# Patient Record
Sex: Female | Born: 2000 | Race: Black or African American | Hispanic: No | Marital: Single | Smoking: Never smoker
Health system: Southern US, Community
[De-identification: ages and names within clinical notes are randomized; demographics above are authoritative.]

## PROBLEM LIST (undated history)

## (undated) DIAGNOSIS — J45909 Unspecified asthma, uncomplicated: Secondary | ICD-10-CM

## (undated) DIAGNOSIS — I10 Essential (primary) hypertension: Secondary | ICD-10-CM

## (undated) HISTORY — PX: ABDOMINAL SURGERY: SHX537

---
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
## 2018-01-11 ENCOUNTER — Telehealth: Payer: Self-pay

## 2018-01-11 NOTE — Telephone Encounter (Signed)
Pt called to confirm upcoming appt.

## 2018-01-13 ENCOUNTER — Ambulatory Visit (INDEPENDENT_AMBULATORY_CARE_PROVIDER_SITE_OTHER): Payer: Medicaid Other | Admitting: Family Medicine

## 2018-01-13 ENCOUNTER — Other Ambulatory Visit (HOSPITAL_COMMUNITY)
Admission: RE | Admit: 2018-01-13 | Discharge: 2018-01-13 | Disposition: A | Discharge status: Home / Self Care | Payer: Medicaid Other | Source: Ambulatory Visit | Attending: Family Medicine | Admitting: Family Medicine

## 2018-01-13 ENCOUNTER — Encounter: Payer: Self-pay | Admitting: Family Medicine

## 2018-01-13 VITALS — BP 111/69 | HR 84 | Ht 64.5 in | Wt 285.6 lb

## 2018-01-13 DIAGNOSIS — Z87440 Personal history of urinary (tract) infections: Secondary | ICD-10-CM

## 2018-01-13 DIAGNOSIS — Z3046 Encounter for surveillance of implantable subdermal contraceptive: Secondary | ICD-10-CM | POA: Diagnosis not present

## 2018-01-13 DIAGNOSIS — Z01419 Encounter for gynecological examination (general) (routine) without abnormal findings: Secondary | ICD-10-CM

## 2018-01-13 DIAGNOSIS — Z0000 Encounter for general adult medical examination without abnormal findings: Secondary | ICD-10-CM

## 2018-01-13 MED ORDER — DROSPIRENONE-ETHINYL ESTRADIOL 3-0.03 MG PO TABS: 1.0000 | ORAL_TABLET | Freq: Every day | ORAL | 4 refills | Status: DC

## 2018-01-13 NOTE — Progress Notes (Signed)
GYNECOLOGY OFFICE VISIT NOTE History:  17 y.o. G0P0000 here today for wellness visit. She denies any abnormal vaginal discharge, bleeding, pelvic pain or other concerns.   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   Review of Systems:  Pertinent items noted in HPI Review of Systems  Constitutional: Negative for chills and fever.  Respiratory: Negative for cough and shortness of breath.   Cardiovascular: Negative for chest pain.  Gastrointestinal: Negative for nausea and vomiting.  Genitourinary: Negative for dysuria and frequency.  Skin: Negative for rash.  Neurological: Negative for headaches.  Psychiatric/Behavioral: The patient does not have insomnia.     Objective:  Physical Exam BP 111/69   Pulse 84   Ht 5' 4.5" (1.638 m)   Wt 285 lb 9.6 oz (129.5 kg)   BMI 48.27 kg/m  Physical Exam  Constitutional: She is oriented to person, place, and time. She appears well-developed and well-nourished. No distress.  HENT:  Head: Normocephalic and atraumatic.  Eyes: Conjunctivae and EOM are normal.  Cardiovascular: Normal rate.  Pulmonary/Chest: No respiratory distress.    Musculoskeletal:  Palpable Nexplanon in left upper arm   Neurological: She is alert and oriented to person, place, and time.  Skin: Skin is warm and dry.  Psychiatric: She has a normal mood and affect. Her behavior is normal.  Nursing note and vitals reviewed.  Labs and Imaging No results found for this or any previous visit (from the past 168 hour(s)). No results found.  Assessment & Plan:   17yo female here for well-woman visit and Nexplanon removal.   Well-Woman - wet prep with G/C pending, repeat urine culture given recent UTI but no longer having symptoms  - encouraged to get flu shot - counseling on healthy diet and exercise  - contraceptive counseling - reviewed most effective methods, chose OCPs - counseled on failure rate, encouraged to take packs back-to-back to have 4 periods/year - counseled on use of Plan B   Nexplanon Removal Patient identified, informed consent performed, consent signed.   Appropriate time out taken. Nexplanon site identified.  Area prepped in usual sterile fashon. One ml of 1% lidocaine was used to anesthetize the area at the distal end of the implant. A small stab incision was made right beside the implant on the distal portion.  The Nexplanon rod was grasped using hemostats and removed without difficulty.  There was minimal blood loss. There were no complications.  3 ml of 1% lidocaine was injected around the incision for post-procedure analgesia.  Steri-strips were applied over the small incision.  A pressure bandage was applied to reduce any bruising.  The patient tolerated the  procedure well and was given post procedure instructions.  Patient is planning to use OCPs for contraception/attempt conception.  Total face-to-face time with patient: 30 minutes.  Over 50% of encounter was spent on counseling and coordination of care.  Cristal Deer. Earlene Plater, DO OB Family Medicine Fellow, [facility] for [employer], [facility]

## 2018-01-13 NOTE — Patient Instructions (Signed)
Take birth control pills as prescribed with only 4 periods per year. Please send Mychart message if you have questions or concerns.

## 2018-01-13 NOTE — Progress Notes (Signed)
Patient is in the office for annual, desires nexplanon removal. Pt states she has had weight gain since nexplanon inserted 09-05-15.  [facility] method that she would like to change to. Pt states she recently had std testing at urgent care, but did not finish her medication.

## 2018-01-14 LAB — CERVICOVAGINAL ANCILLARY ONLY
BACTERIAL VAGINITIS: NEGATIVE
Candida vaginitis: NEGATIVE
Chlamydia: NEGATIVE
NEISSERIA GONORRHEA: NEGATIVE
TRICH (WINDOWPATH): NEGATIVE

## 2018-01-16 LAB — URINE CULTURE

## 2018-01-29 ENCOUNTER — Ambulatory Visit (HOSPITAL_COMMUNITY)
Admission: EM | Admit: 2018-01-29 | Discharge: 2018-01-29 | Disposition: A | Discharge status: Home / Self Care | Payer: Medicaid Other | Attending: Internal Medicine | Admitting: Internal Medicine

## 2018-01-29 ENCOUNTER — Encounter (HOSPITAL_COMMUNITY): Payer: Self-pay

## 2018-01-29 ENCOUNTER — Other Ambulatory Visit: Payer: Self-pay

## 2018-01-29 DIAGNOSIS — N390 Urinary tract infection, site not specified: Secondary | ICD-10-CM

## 2018-01-29 LAB — POCT URINALYSIS DIP (DEVICE)
Bilirubin Urine: NEGATIVE
Glucose, UA: NEGATIVE mg/dL
Nitrite: NEGATIVE
PH: 6 (ref 5.0–8.0)
Protein, ur: NEGATIVE mg/dL
Specific Gravity, Urine: >=1.03 (ref 1.005–1.030)
Urobilinogen, UA: 1 mg/dL (ref 0.0–1.0)

## 2018-01-29 LAB — POCT PREGNANCY, URINE: PREG TEST UR: NEGATIVE

## 2018-01-29 MED ORDER — NITROFURANTOIN MONOHYD MACRO 100 MG PO CAPS: 100.0000 mg | ORAL_CAPSULE | Freq: Two times a day (BID) | ORAL | 0 refills | Status: DC

## 2018-01-29 NOTE — ED Provider Notes (Signed)
[facility]    CSN: [id number] Arrival date & time: 01/29/18  1600     History   Chief Complaint Chief Complaint  Patient presents with  . Urinary Tract Infection    HPI Hannah Sherman is a 17 y.o. female.   17 year old female presents with concern for urinary tract infection.  Patient states that she was recently had her birth control removed and was tested for urinary infection at that time.  Patient patient has her AVS from that visit that states that she is a coli in her urine however the presence of those symptoms was not being treated.  Patient states that her grandmother would like for her to be evaluated at this time for urinary tract infection.  Patient reports an increase in frequency nut denies any dysuria or urgency. Patient denies any fevers, adominal pain, or vaginal discahrge.      Past Medical History:  Diagnosis Date  . Asthma     There are no active problems to display for this patient.   Past Surgical History:  Procedure Laterality Date  . ABDOMINAL SURGERY    . TONSILLECTOMY      OB History    Gravida  0   Para  0   Term  0   Preterm  0   AB  0   Living  0     SAB  0   TAB  0   Ectopic  0   Multiple  0   Live Births               Home Medications    Prior to Admission medications   Medication Sig Start Date End Date Taking? Authorizing Provider  drospirenone-ethinyl estradiol (YASMIN,ZARAH,SYEDA) 3-0.03 MG tablet Take 1 tablet by mouth daily. 01/13/18   Tamera StandsWallace, Laurel S, DO  ibuprofen (ADVIL,MOTRIN) 600 MG tablet Take 1 tablet (600 mg total) by mouth daily as needed for headache. 12/26/17   Ofilia Neaslark, Michael L, PA-C    Family History Family History  Problem Relation Age of Onset  . Hypertension Maternal Grandmother   . Healthy Mother   . Healthy Father     Social History Social History   Tobacco Use  . Smoking status: Never Smoker  . Smokeless tobacco: Never Used  Substance Use Topics  . Alcohol  use: No    Alcohol/week: 0.0 standard drinks  . Drug use: No     Allergies   Patient has no known allergies.   Review of Systems Review of Systems  Constitutional: Negative for chills and fever.  HENT: Negative for ear pain and sore throat.   Eyes: Negative for pain and visual disturbance.  Respiratory: Negative for cough and shortness of breath.   Cardiovascular: Negative for chest pain and palpitations.  Gastrointestinal: Negative for abdominal pain and vomiting.  Genitourinary: Positive for frequency. Negative for dysuria and hematuria.  Musculoskeletal: Negative for arthralgias and back pain.  Skin: Negative for color change and rash.  Neurological: Negative for seizures and syncope.  All other systems reviewed and are negative.    Physical Exam Triage Vital Signs ED Triage Vitals [01/29/18 1653]  Enc Vitals Group     BP 127/67     Pulse      Resp 18     Temp 98.1 F (36.7 C)     Temp Source Oral     SpO2      Weight 286 lb (129.7 kg)     Height  Head Circumference      Peak Flow      Pain Score 5     Pain Loc      Pain Edu?      Excl. in GC?    No data found.  Updated Vital Signs BP 127/67 (BP Location: Left Arm)   Temp 98.1 F (36.7 C) (Oral)   Resp 18   Wt 286 lb (129.7 kg)   BMI 48.33 kg/m   Visual Acuity Right Eye Distance:   Left Eye Distance:   Bilateral Distance:    Right Eye Near:   Left Eye Near:    Bilateral Near:     Physical Exam  Constitutional: She is oriented to person, place, and time. She appears well-developed and well-nourished.  HENT:  Head: Normocephalic and atraumatic.  Eyes: Conjunctivae are normal.  Neck: Normal range of motion.  Pulmonary/Chest: Effort normal.  Musculoskeletal: Normal range of motion.  Neurological: She is alert and oriented to person, place, and time.  Skin: Skin is warm.  Psychiatric: She has a normal mood and affect.  Nursing note and vitals reviewed.    UC Treatments / Results    Labs (all labs ordered are listed, but only abnormal results are displayed) Labs Reviewed - No data to display  EKG None  Radiology No results found.  Procedures Procedures (including critical care time)  Medications Ordered in UC Medications - No data to display  Initial Impression / Assessment and Plan / UC Course  I have reviewed the triage vital signs and the nursing notes.  Pertinent labs & imaging results that were available during my care of the patient were reviewed by me and considered in my medical decision making (see chart for details).      Final Clinical Impressions(s) / UC Diagnoses   Final diagnoses:  None   Discharge Instructions   None    ED Prescriptions    None     Controlled Substance Prescriptions [place] Controlled Substance Registry consulted? Not Applicable   Alene Mires, NP 01/29/18 1759

## 2018-01-29 NOTE — ED Triage Notes (Signed)
Pt cc pt state she has a UTI. Pt was told she had bactrica in her urine.  3 week ago she looked it up on her my chart.

## 2018-03-28 ENCOUNTER — Other Ambulatory Visit: Payer: Self-pay

## 2018-03-28 ENCOUNTER — Ambulatory Visit (HOSPITAL_COMMUNITY)
Admission: EM | Admit: 2018-03-28 | Discharge: 2018-03-28 | Disposition: A | Discharge status: Home / Self Care | Payer: Medicaid Other | Attending: Internal Medicine | Admitting: Internal Medicine

## 2018-03-28 ENCOUNTER — Encounter (HOSPITAL_COMMUNITY): Payer: Self-pay | Admitting: Emergency Medicine

## 2018-03-28 DIAGNOSIS — J069 Acute upper respiratory infection, unspecified: Secondary | ICD-10-CM

## 2018-03-28 DIAGNOSIS — B9789 Other viral agents as the cause of diseases classified elsewhere: Secondary | ICD-10-CM | POA: Diagnosis not present

## 2018-03-28 DIAGNOSIS — Z3202 Encounter for pregnancy test, result negative: Secondary | ICD-10-CM | POA: Diagnosis not present

## 2018-03-28 DIAGNOSIS — R05 Cough: Secondary | ICD-10-CM | POA: Diagnosis not present

## 2018-03-28 LAB — POCT PREGNANCY, URINE: PREG TEST UR: NEGATIVE

## 2018-03-28 LAB — POCT URINALYSIS DIP (DEVICE)
Bilirubin Urine: NEGATIVE
Glucose, UA: NEGATIVE mg/dL
Hgb urine dipstick: NEGATIVE
Leukocytes, UA: NEGATIVE
Nitrite: NEGATIVE
PH: 5.5 (ref 5.0–8.0)
Protein, ur: NEGATIVE mg/dL
Specific Gravity, Urine: 1.025 (ref 1.005–1.030)
Urobilinogen, UA: 0.2 mg/dL (ref 0.0–1.0)

## 2018-03-28 MED ORDER — CETIRIZINE-PSEUDOEPHEDRINE ER 5-120 MG PO TB12: 1.0000 | ORAL_TABLET | Freq: Every day | ORAL | 0 refills | Status: DC

## 2018-03-28 MED ORDER — DEXAMETHASONE SODIUM PHOSPHATE 10 MG/ML IJ SOLN
10.0000 mg | Freq: Once | INTRAMUSCULAR | Status: AC
  Administered 2018-03-28: 10 mg

## 2018-03-28 MED ORDER — DEXAMETHASONE 10 MG/ML FOR PEDIATRIC ORAL USE
INTRAMUSCULAR | Status: AC
  Filled 2018-03-28: qty 1

## 2018-03-28 NOTE — ED Triage Notes (Signed)
Pt reports nasal congestion, headache, sore throat and cough x1 week.  She also reports some nausea, and would like to have a pregnancy test.

## 2018-03-28 NOTE — ED Provider Notes (Signed)
[facility]    CSN: [id number] Arrival date & time: 03/28/18  1540     History   Chief Complaint Chief Complaint  Patient presents with  . URI  . Possible Pregnancy    HPI Hannah Sherman is a 18 y.o. female.   Patient is a 18 year old female that presents today with URI symptoms.  She is reporting nasal congestion, headache, sore throat and cough x1 week. Her symptoms been constant remain the same.  She has been taking over-the-counter medication with not much symptomatic relief. Denies any fevers, chills, myalgias.  Patient also reporting some generalized abdominal discomfort.  She is here requesting pregnancy test.  Her last menstrual period was 02/04/2018. Denies any vaginal discharge, itching, irritation.     Past Medical History:  Diagnosis Date  . Asthma     There are no active problems to display for this patient.   Past Surgical History:  Procedure Laterality Date  . ABDOMINAL SURGERY    . TONSILLECTOMY      OB History    Gravida  0   Para  0   Term  0   Preterm  0   AB  0   Living  0     SAB  0   TAB  0   Ectopic  0   Multiple  0   Live Births               Home Medications    Prior to Admission medications   Medication Sig Start Date End Date Taking? Authorizing Provider  cetirizine-pseudoephedrine (ZYRTEC-D) 5-120 MG tablet Take 1 tablet by mouth daily. 03/28/18   Dahlia ByesBast, Anibal Quinby A, NP  drospirenone-ethinyl estradiol (YASMIN,ZARAH,SYEDA) 3-0.03 MG tablet Take 1 tablet by mouth daily. 01/13/18   Tamera StandsWallace, Laurel S, DO  ibuprofen (ADVIL,MOTRIN) 600 MG tablet Take 1 tablet (600 mg total) by mouth daily as needed for headache. 12/26/17   Ofilia Neaslark, Michael L, PA-C    Family History Family History  Problem Relation Age of Onset  . Hypertension Maternal Grandmother   . Healthy Mother   . Healthy Father     Social History Social History   Tobacco Use  . Smoking status: Never Smoker  . Smokeless tobacco: Never Used    Substance Use Topics  . Alcohol use: No    Alcohol/week: 0.0 standard drinks  . Drug use: No     Allergies   Patient has no known allergies.   Review of Systems Review of Systems   Physical Exam Triage Vital Signs ED Triage Vitals  Enc Vitals Group     BP 03/28/18 1552 117/79     Pulse Rate 03/28/18 1552 77     Resp 03/28/18 1552 16     Temp 03/28/18 1552 98.2 F (36.8 C)     Temp Source 03/28/18 1552 Oral     SpO2 03/28/18 1552 100 %     Weight --      Height --      Head Circumference --      Peak Flow --      Pain Score 03/28/18 1556 4     Pain Loc --      Pain Edu? --      Excl. in GC? --    No data found.  Updated Vital Signs BP 117/79 (BP Location: Left Arm)   Pulse 77   Temp 98.2 F (36.8 C) (Oral)   Resp 16   LMP 02/04/2018 (Approximate)  SpO2 100%   Visual Acuity Right Eye Distance:   Left Eye Distance:   Bilateral Distance:    Right Eye Near:   Left Eye Near:    Bilateral Near:     Physical Exam Vitals signs and nursing note reviewed.  Constitutional:      General: She is not in acute distress.    Appearance: Normal appearance. She is well-developed. She is not ill-appearing, toxic-appearing or diaphoretic.  HENT:     Head: Normocephalic and atraumatic.     Right Ear: Tympanic membrane and ear canal normal.     Left Ear: Tympanic membrane and ear canal normal.     Nose: Nasal tenderness, mucosal edema, congestion and rhinorrhea present.     Right Turbinates: Swollen and pale.     Left Turbinates: Swollen and pale.  Eyes:     Conjunctiva/sclera: Conjunctivae normal.  Neck:     Musculoskeletal: Neck supple.  Cardiovascular:     Rate and Rhythm: Normal rate and regular rhythm.     Heart sounds: No murmur.  Pulmonary:     Effort: Pulmonary effort is normal. No respiratory distress.     Breath sounds: Normal breath sounds.  Abdominal:     Palpations: Abdomen is soft.     Tenderness: There is no abdominal tenderness.   Musculoskeletal: Normal range of motion.  Skin:    General: Skin is warm and dry.  Neurological:     Mental Status: She is alert.  Psychiatric:        Mood and Affect: Mood normal.      UC Treatments / Results  Labs (all labs ordered are listed, but only abnormal results are displayed) Labs Reviewed  POCT URINALYSIS DIP (DEVICE) - Abnormal; Notable for the following components:      Result Value   Ketones, ur TRACE (*)    All other components within normal limits  POCT PREGNANCY, URINE    EKG None  Radiology No results found.  Procedures Procedures (including critical care time)  Medications Ordered in UC Medications  dexamethasone (DECADRON) injection 10 mg (10 mg Other Given 03/28/18 1642)    Initial Impression / Assessment and Plan / UC Course  I have reviewed the triage vital signs and the nursing notes.  Pertinent labs & imaging results that were available during my care of the patient were reviewed by me and considered in my medical decision making (see chart for details).     Urine was negative for pregnancy or infection Her respiratory symptoms are consistent with a viral upper respiratory infection Steroid injection given here in clinic for severe nasal turbinate swelling and inflammation Zyrtec-D to help with congestion and drainage Follow up as needed for continued or worsening symptoms  Final Clinical Impressions(s) / UC Diagnoses   Final diagnoses:  Viral URI with cough     Discharge Instructions     Your urine was negative for pregnancy or infection We will go ahead and treat you today for viral upper respiratory infection I am giving you a dose of steroids here in clinic to help with the nasal swelling and inflammation Zyrtec-D to help with the congestion and drainage Follow up as needed for continued or worsening symptoms     ED Prescriptions    Medication Sig Dispense Auth. Provider   cetirizine-pseudoephedrine (ZYRTEC-D) 5-120 MG  tablet Take 1 tablet by mouth daily. 30 tablet Dahlia ByesBast, Leyah Bocchino A, NP     Controlled Substance Prescriptions [place] Controlled Substance Registry consulted? Not Applicable  Dahlia Byes A, NP 03/29/18 [phone]

## 2018-03-28 NOTE — Discharge Instructions (Signed)
Your urine was negative for pregnancy or infection We will go ahead and treat you today for viral upper respiratory infection I am giving you a dose of steroids here in clinic to help with the nasal swelling and inflammation Zyrtec-D to help with the congestion and drainage Follow up as needed for continued or worsening symptoms

## 2018-05-02 ENCOUNTER — Ambulatory Visit (HOSPITAL_COMMUNITY)
Admission: EM | Admit: 2018-05-02 | Discharge: 2018-05-02 | Disposition: A | Discharge status: Home / Self Care | Payer: Medicaid Other | Attending: Internal Medicine | Admitting: Internal Medicine

## 2018-05-02 ENCOUNTER — Other Ambulatory Visit: Payer: Self-pay

## 2018-05-02 ENCOUNTER — Encounter (HOSPITAL_COMMUNITY): Payer: Self-pay

## 2018-05-02 DIAGNOSIS — O0001 Abdominal pregnancy with intrauterine pregnancy: Secondary | ICD-10-CM | POA: Diagnosis not present

## 2018-05-02 DIAGNOSIS — E860 Dehydration: Secondary | ICD-10-CM

## 2018-05-02 DIAGNOSIS — Z3201 Encounter for pregnancy test, result positive: Secondary | ICD-10-CM | POA: Diagnosis not present

## 2018-05-02 LAB — POCT URINALYSIS DIP (DEVICE)
GLUCOSE, UA: NEGATIVE mg/dL
Hgb urine dipstick: NEGATIVE
Leukocytes,Ua: NEGATIVE
Nitrite: POSITIVE — AB
Protein, ur: NEGATIVE mg/dL
Specific Gravity, Urine: >=1.03 (ref 1.005–1.030)
UROBILINOGEN UA: 1 mg/dL (ref 0.0–1.0)
pH: 6 (ref 5.0–8.0)

## 2018-05-02 LAB — POCT PREGNANCY, URINE: Preg Test, Ur: POSITIVE — AB

## 2018-05-02 NOTE — ED Provider Notes (Signed)
[facility]    CSN: [id number] Arrival date & time: 05/02/18  1833     History   Chief Complaint Chief Complaint  Patient presents with  . Urinary Tract Infection    HPI Hannah Sherman is a 18 y.o. female a history of asthma comes in with complaints of a cloudy urine over the past few days.  Patient is wondering if she has urinary tract infection.  She denies any fever or chills.  No dysuria urgency or frequency.  No nausea or vomiting.  No flank pain..     Past Medical History:  Diagnosis Date  . Asthma     There are no active problems to display for this patient.   Past Surgical History:  Procedure Laterality Date  . ABDOMINAL SURGERY    . TONSILLECTOMY      OB History    Gravida  0   Para  0   Term  0   Preterm  0   AB  0   Living  0     SAB  0   TAB  0   Ectopic  0   Multiple  0   Live Births               Home Medications    Prior to Admission medications   Medication Sig Start Date End Date Taking? Authorizing Provider  cetirizine-pseudoephedrine (ZYRTEC-D) 5-120 MG tablet Take 1 tablet by mouth daily. 03/28/18   Dahlia Byes A, NP  drospirenone-ethinyl estradiol (YASMIN,ZARAH,SYEDA) 3-0.03 MG tablet Take 1 tablet by mouth daily. 01/13/18   Tamera Stands, DO  ibuprofen (ADVIL,MOTRIN) 600 MG tablet Take 1 tablet (600 mg total) by mouth daily as needed for headache. 12/26/17   Ofilia Neas, PA-C    Family History Family History  Problem Relation Age of Onset  . Hypertension Maternal Grandmother   . Healthy Mother   . Healthy Father     Social History Social History   Tobacco Use  . Smoking status: Never Smoker  . Smokeless tobacco: Never Used  Substance Use Topics  . Alcohol use: No    Alcohol/week: 0.0 standard drinks  . Drug use: No     Allergies   Patient has no known allergies.   Review of Systems Review of Systems  Constitutional: Negative for activity change and appetite change.    Gastrointestinal: Negative for constipation, nausea and vomiting.  Genitourinary: Positive for flank pain. Negative for dysuria, frequency, pelvic pain, urgency and vaginal discharge.  Musculoskeletal: Positive for back pain. Negative for arthralgias and myalgias.  Skin: Negative for rash.  Neurological: Negative for dizziness, weakness and light-headedness.  Hematological: Negative for adenopathy.     Physical Exam Triage Vital Signs ED Triage Vitals  Enc Vitals Group     BP 05/02/18 1846 (!) 149/84     Pulse Rate 05/02/18 1846 86     Resp 05/02/18 1846 18     Temp 05/02/18 1846 98.2 F (36.8 C)     Temp src --      SpO2 05/02/18 1846 100 %     Weight --      Height --      Head Circumference --      Peak Flow --      Pain Score 05/02/18 1848 5     Pain Loc --      Pain Edu? --      Excl. in GC? --    No data found.  Updated Vital Signs BP (!) 149/84 (BP Location: Right Arm)   Pulse 86   Temp 98.2 F (36.8 C)   Resp 18   SpO2 100%   Visual Acuity Right Eye Distance:   Left Eye Distance:   Bilateral Distance:    Right Eye Near:   Left Eye Near:    Bilateral Near:     Physical Exam Constitutional:      General: She is not in acute distress.    Appearance: Normal appearance. She is obese. She is not ill-appearing.  Neck:     Musculoskeletal: No neck rigidity.  Cardiovascular:     Rate and Rhythm: Normal rate and regular rhythm.     Pulses: Normal pulses.     Heart sounds: Normal heart sounds.  Pulmonary:     Effort: Pulmonary effort is normal. No respiratory distress.     Breath sounds: Normal breath sounds. No wheezing or rales.  Abdominal:     General: Bowel sounds are normal. There is no distension.     Palpations: Abdomen is soft.     Tenderness: There is no abdominal tenderness.  Musculoskeletal: Normal range of motion.        General: No swelling or tenderness.  Lymphadenopathy:     Cervical: No cervical adenopathy.  Skin:    General: Skin  is warm.     Capillary Refill: Capillary refill takes less than 2 seconds.     Coloration: Skin is not pale.     Findings: No erythema or rash.  Neurological:     Mental Status: She is alert.      UC Treatments / Results  Labs (all labs ordered are listed, but only abnormal results are displayed) Labs Reviewed  POC URINE PREG, ED    EKG None  Radiology No results found.  Procedures Procedures (including critical care time)  Medications Ordered in UC Medications - No data to display  Initial Impression / Assessment and Plan / UC Course  I have reviewed the triage vital signs and the nursing notes.  Pertinent labs & imaging results that were available during my care of the patient were reviewed by me and considered in my medical decision making (see chart for details).     1.  Possible urinary tract infection-UTI ruled out: Urinalysis shows a specific gravity greater than 1.030 indicating for dehydration Patient is encouraged to increase oral fluid intake Urinalysis is negative for urinary tract infection  2.  First trimester pregnancy: Patient has been given the results of the urine pregnancy test She will need urine quant for hCG  Final Clinical Impressions(s) / UC Diagnoses   Final diagnoses:  None   Discharge Instructions   None    ED Prescriptions    None     Controlled Substance Prescriptions [place] Controlled Substance Registry consulted? No   Merrilee Jansky, MD 05/02/18 [phone]

## 2018-05-02 NOTE — ED Triage Notes (Signed)
Pt cc she voiding a lot and has some pressure when voiding.

## 2018-05-15 ENCOUNTER — Emergency Department (HOSPITAL_COMMUNITY): Payer: Medicaid Other

## 2018-05-15 ENCOUNTER — Encounter (HOSPITAL_COMMUNITY): Payer: Self-pay | Admitting: Emergency Medicine

## 2018-05-15 ENCOUNTER — Other Ambulatory Visit: Payer: Self-pay

## 2018-05-15 ENCOUNTER — Emergency Department (HOSPITAL_COMMUNITY)
Admission: EM | Admit: 2018-05-15 | Discharge: 2018-05-15 | Disposition: A | Discharge status: Home / Self Care | Payer: Medicaid Other | Attending: Emergency Medicine | Admitting: Emergency Medicine

## 2018-05-15 DIAGNOSIS — Y999 Unspecified external cause status: Secondary | ICD-10-CM | POA: Diagnosis not present

## 2018-05-15 DIAGNOSIS — J45909 Unspecified asthma, uncomplicated: Secondary | ICD-10-CM | POA: Diagnosis not present

## 2018-05-15 DIAGNOSIS — Y92410 Unspecified street and highway as the place of occurrence of the external cause: Secondary | ICD-10-CM | POA: Insufficient documentation

## 2018-05-15 DIAGNOSIS — M25532 Pain in left wrist: Secondary | ICD-10-CM

## 2018-05-15 DIAGNOSIS — Y9389 Activity, other specified: Secondary | ICD-10-CM | POA: Insufficient documentation

## 2018-05-15 DIAGNOSIS — Z79899 Other long term (current) drug therapy: Secondary | ICD-10-CM | POA: Insufficient documentation

## 2018-05-15 DIAGNOSIS — S62313A Displaced fracture of base of third metacarpal bone, left hand, initial encounter for closed fracture: Secondary | ICD-10-CM

## 2018-05-15 DIAGNOSIS — M25562 Pain in left knee: Secondary | ICD-10-CM | POA: Diagnosis not present

## 2018-05-15 MED ORDER — ACETAMINOPHEN 325 MG PO TABS: 325.0000 mg | ORAL_TABLET | Freq: Four times a day (QID) | ORAL | 0 refills | Status: DC | PRN

## 2018-05-15 MED ORDER — ACETAMINOPHEN 325 MG PO TABS
325.0000 mg | ORAL_TABLET | Freq: Once | ORAL | Status: AC
  Administered 2018-05-15: 325 mg via ORAL
  Filled 2018-05-15: qty 1
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
## 2018-05-15 NOTE — Progress Notes (Signed)
Orthopedic Tech Progress Note Patient Details:  Hannah Sherman [birth year] [id number]  Ortho Devices Type of Ortho Device: Ace wrap, Volar splint Ortho Device/Splint Interventions: Application   Post Interventions Patient Tolerated: Well Instructions Provided: Care of device   Saul Fordyce 05/15/2018, 6:33 PM

## 2018-05-15 NOTE — ED Notes (Signed)
Patient transported to X-ray 

## 2018-06-02 ENCOUNTER — Other Ambulatory Visit: Payer: Self-pay

## 2018-06-02 ENCOUNTER — Inpatient Hospital Stay (HOSPITAL_COMMUNITY): Payer: Medicaid Other

## 2018-06-02 ENCOUNTER — Inpatient Hospital Stay (HOSPITAL_COMMUNITY)
Admission: AD | Admit: 2018-06-02 | Discharge: 2018-06-02 | Disposition: A | Discharge status: Home / Self Care | Payer: Medicaid Other | Attending: Obstetrics & Gynecology | Admitting: Obstetrics & Gynecology

## 2018-06-02 ENCOUNTER — Encounter: Payer: Self-pay | Admitting: Advanced Practice Midwife

## 2018-06-02 ENCOUNTER — Encounter (HOSPITAL_COMMUNITY): Payer: Self-pay

## 2018-06-02 DIAGNOSIS — Z3A08 8 weeks gestation of pregnancy: Secondary | ICD-10-CM | POA: Diagnosis not present

## 2018-06-02 DIAGNOSIS — O209 Hemorrhage in early pregnancy, unspecified: Secondary | ICD-10-CM | POA: Diagnosis not present

## 2018-06-02 DIAGNOSIS — O208 Other hemorrhage in early pregnancy: Secondary | ICD-10-CM | POA: Insufficient documentation

## 2018-06-02 DIAGNOSIS — J45909 Unspecified asthma, uncomplicated: Secondary | ICD-10-CM | POA: Insufficient documentation

## 2018-06-02 DIAGNOSIS — O99211 Obesity complicating pregnancy, first trimester: Secondary | ICD-10-CM | POA: Insufficient documentation

## 2018-06-02 DIAGNOSIS — E669 Obesity, unspecified: Secondary | ICD-10-CM | POA: Insufficient documentation

## 2018-06-02 DIAGNOSIS — O99511 Diseases of the respiratory system complicating pregnancy, first trimester: Secondary | ICD-10-CM | POA: Insufficient documentation

## 2018-06-02 DIAGNOSIS — O3680X0 Pregnancy with inconclusive fetal viability, not applicable or unspecified: Secondary | ICD-10-CM | POA: Diagnosis not present

## 2018-06-02 LAB — CBC
HCT: 38.3 % (ref 36.0–46.0)
Hemoglobin: 13 g/dL (ref 12.0–15.0)
MCH: 30.1 pg (ref 26.0–34.0)
MCHC: 33.9 g/dL (ref 30.0–36.0)
MCV: 88.7 fL (ref 80.0–100.0)
Platelets: 311 10*3/uL (ref 150–400)
RBC: 4.32 MIL/uL (ref 3.87–5.11)
RDW: 12.8 % (ref 11.5–15.5)
WBC: 5.6 10*3/uL (ref 4.0–10.5)
nRBC: 0 % (ref 0.0–0.2)

## 2018-06-02 LAB — ABO/RH: ABO/RH(D): B POS

## 2018-06-02 LAB — WET PREP, GENITAL
Sperm: NONE SEEN
Trich, Wet Prep: NONE SEEN
Yeast Wet Prep HPF POC: NONE SEEN

## 2018-06-02 LAB — GC/CHLAMYDIA PROBE AMP (~~LOC~~) NOT AT ARMC
Chlamydia: NEGATIVE
Neisseria Gonorrhea: NEGATIVE

## 2018-06-02 LAB — HIV ANTIBODY (ROUTINE TESTING W REFLEX): HIV Screen 4th Generation wRfx: NONREACTIVE

## 2018-06-02 LAB — HCG, QUANTITATIVE, PREGNANCY: hCG, Beta Chain, Quant, S: 3277 m[IU]/mL — ABNORMAL HIGH (ref <5)

## 2018-06-02 NOTE — MAU Note (Signed)
Pt states that sometime around midnight tonight she started having vaginal bleeding.   Pt states the bleeding is lighter than a period.   Pt reports 1 sharp pain on her left side just now.

## 2018-06-02 NOTE — Discharge Instructions (Signed)
Vaginal Bleeding During Pregnancy, First Trimester    A small amount of bleeding (spotting) from the vagina is common during early pregnancy. Sometimes the bleeding is normal and does not cause problems. At other times, though, bleeding may be a sign of something serious. Tell your doctor about any bleeding from your vagina right away.  Follow these instructions at home:  Activity  · Follow your doctor's instructions about how active you can be.  · If needed, make plans for someone to help with your normal activities.  · Do not have sex or orgasms until your doctor says that this is safe.  General instructions  · Take over-the-counter and prescription medicines only as told by your doctor.  · Watch your condition for any changes.  · Write down:  ? The number of pads you use each day.  ? How often you change pads.  ? How soaked (saturated) your pads are.  · Do not use tampons.  · Do not douche.  · If you pass any tissue from your vagina, save it to show to your doctor.  · Keep all follow-up visits as told by your doctor. This is important.  Contact a doctor if:  · You have vaginal bleeding at any time while you are pregnant.  · You have cramps.  · You have a fever.  Get help right away if:  · You have very bad cramps in your back or belly (abdomen).  · You pass large clots or a lot of tissue from your vagina.  · Your bleeding gets worse.  · You feel light-headed.  · You feel weak.  · You pass out (faint).  · You have chills.  · You are leaking fluid from your vagina.  · You have a gush of fluid from your vagina.  Summary  · Sometimes vaginal bleeding during pregnancy is normal and does not cause problems. At other times, bleeding may be a sign of something serious.  · Tell your doctor about any bleeding from your vagina right away.  · Follow your doctor's instructions about how active you can be. You may need someone to help you with your normal activities.  This information is not intended to replace advice given to  you by your health care provider. Make sure you discuss any questions you have with your health care provider.  Document Released: 06/26/2013 Document Revised: 05/13/2016 Document Reviewed: 05/13/2016  Elsevier Interactive Patient Education © 2019 [employer]

## 2018-06-02 NOTE — MAU Provider Note (Signed)
Chief Complaint: Vaginal Bleeding   First Provider Initiated Contact with Patient 06/02/18 0106        SUBJECTIVE HPI: Hannah Sherman is a 18 y.o. G1P0000 at [redacted]w[redacted]d by LMP who presents to maternity admissions reporting vaginal bleeding tonight.  No cramping.  States has had a bedside US in ED which "saw a heartbeat" and another [place] [facility]" which "saw a heartbeat".  Has not had a formal US yet, nor any prenatal care or appointments.. She denies vaginal itching/burning, urinary symptoms, h/a, dizziness, n/v, or fever/chills.    Vaginal Bleeding  The patient's primary symptoms include vaginal bleeding. The patient's pertinent negatives include no genital itching, genital lesions, genital odor or pelvic pain. This is a new problem. The current episode started today. The problem occurs intermittently. She is pregnant. Pertinent negatives include no abdominal pain, constipation, diarrhea, fever, nausea or vomiting. The vaginal discharge was bloody. The vaginal bleeding is lighter than menses. She has not been passing clots. She has not been passing tissue. Nothing aggravates the symptoms. She has tried nothing for the symptoms.   RN Note: Pt states that sometime around midnight tonight she started having vaginal bleeding.  Pt states the bleeding is lighter than a period.  Pt reports 1 sharp pain on her left side just now.   Past Medical History:  Diagnosis Date  . Asthma    Past Surgical History:  Procedure Laterality Date  . ABDOMINAL SURGERY    . TONSILLECTOMY    . TONSILLECTOMY AND ADENOIDECTOMY  2003   Social History   Socioeconomic History  . Marital status: Single    Spouse name: Not on file  . Number of children: Not on file  . Years of education: Not on file  . Highest education level: Not on file  Occupational History  . Not on file  Social Needs  . Financial resource strain: Not on file  . Food insecurity:    Worry: Not on file    Inability: Not on  file  . Transportation needs:    Medical: Not on file    Non-medical: Not on file  Tobacco Use  . Smoking status: Never Smoker  . Smokeless tobacco: Never Used  Substance and Sexual Activity  . Alcohol use: No    Alcohol/week: 0.0 standard drinks  . Drug use: No  . Sexual activity: Not Currently    Birth control/protection: None  Lifestyle  . Physical activity:    Days per week: Not on file    Minutes per session: Not on file  . Stress: Not on file  Relationships  . Social connections:    Talks on phone: Not on file    Gets together: Not on file    Attends religious service: Not on file    Active member of club or organization: Not on file    Attends meetings of clubs or organizations: Not on file    Relationship status: Not on file  . Intimate partner violence:    Fear of current or ex partner: Not on file    Emotionally abused: Not on file    Physically abused: Not on file    Forced sexual activity: Not on file  Other Topics Concern  . Not on file  Social History Narrative  . Not on file   No current facility-administered medications on file prior to encounter.    Current Outpatient Medications on File Prior to Encounter  Medication Sig Dispense Refill  . acetaminophen (TYLENOL)  325 MG tablet Take 1 tablet (325 mg total) by mouth every 6 (six) hours as needed for moderate pain. 20 tablet 0  . albuterol (PROVENTIL HFA;VENTOLIN HFA) 108 (90 Base) MCG/ACT inhaler Inhale 1 puff into the lungs every 6 (six) hours as needed for wheezing or shortness of breath.    . Prenatal Vit-Fe Fumarate-FA (PRENATAL PO) Take 1 tablet by mouth daily.     No Known Allergies  I have reviewed patient's Past Medical Hx, Surgical Hx, Family Hx, Social Hx, medications and allergies.   ROS:  Review of Systems  Constitutional: Negative for fever.  Gastrointestinal: Negative for abdominal pain, constipation, diarrhea, nausea and vomiting.  Genitourinary: Positive for vaginal bleeding.  Negative for pelvic pain.   Review of Systems  Other systems negative   Physical Exam  Physical Exam Patient Vitals for the past 24 hrs:  BP Temp Pulse Resp SpO2  06/02/18 0050 (!) 119/56 98 F (36.7 C) 88 16 99 %   Constitutional: Well-developed, well-nourished female in no acute distress.  Cardiovascular: normal rate Respiratory: normal effort GI: Abd soft, non-tender. Pos BS x 4 MS: Extremities nontender, no edema, normal ROM Neurologic: Alert and oriented x 4.  GU: Neg CVAT.  PELVIC EXAM: Cervix pink, visually closed, without lesion, scant white creamy discharge, vaginal walls and external genitalia normal    Small vaginal bleeding. No active flow.   Bimanual exam: Cervix 0/long/high, firm, anterior, neg CMT, uterus nontender, nonenlarged, adnexa without tenderness, enlargement, or mass   LAB RESULTS Results for orders placed or performed during the hospital encounter of 06/02/18 (from the past 24 hour(s))  Wet prep, genital     Status: Abnormal   Collection Time: 06/02/18  1:30 AM  Result Value Ref Range   Yeast Wet Prep HPF POC NONE SEEN NONE SEEN   Trich, Wet Prep NONE SEEN NONE SEEN   Clue Cells Wet Prep HPF POC PRESENT (A) NONE SEEN   WBC, Wet Prep HPF POC MANY (A) NONE SEEN   Sperm NONE SEEN   CBC     Status: None   Collection Time: 06/02/18  2:00 AM  Result Value Ref Range   WBC 5.6 4.0 - 10.5 K/uL   RBC 4.32 3.87 - 5.11 MIL/uL   Hemoglobin 13.0 12.0 - 15.0 g/dL   HCT 81.7 71.1 - 65.7 %   MCV 88.7 80.0 - 100.0 fL   MCH 30.1 26.0 - 34.0 pg   MCHC 33.9 30.0 - 36.0 g/dL   RDW 90.3 83.3 - 38.3 %   Platelets 311 150 - 400 K/uL   nRBC 0.0 0.0 - 0.2 %  ABO/Rh     Status: None   Collection Time: 06/02/18  2:00 AM  Result Value Ref Range   ABO/RH(D)      B POS Performed at [facility], [address], [place], [place] [address]    HCG pending    IMAGING US Ob Comp Less 14 Wks  Result Date: 06/02/2018 CLINICAL DATA:  Initial evaluation for acute  vaginal bleeding, early pregnancy. EXAM: OBSTETRIC <14 WK [place] AND TRANSVAGINAL OB US TECHNIQUE: Both transabdominal and transvaginal ultrasound examinations were performed for complete evaluation of the gestation as well as the maternal uterus, adnexal regions, and pelvic cul-de-sac. Transvaginal technique was performed to assess early pregnancy. COMPARISON:  None. FINDINGS: Intrauterine gestational sac: Single Yolk sac:  Not visualized. Embryo:  Not visualized. Cardiac Activity: N/A Heart Rate: N/A  bpm MSD: 14 mm   6 w  2 d Subchorionic hemorrhage:  None visualized. Maternal uterus/adnexae: Ovaries within normal limits. No adnexal mass. No free fluid within the pelvis. IMPRESSION: 1. Probable early intrauterine gestational sac, but no yolk sac, fetal pole, or cardiac activity yet visualized. Recommend follow-up quantitative B-HCG levels and follow-up US in 14 days to confirm and assess viability. This recommendation follows SRU consensus guidelines: Diagnostic Criteria for Nonviable Pregnancy Early in the First Trimester. Malva Limes Engl J Med 2013; 161:0960-45; 369:1443-51. 2. No other acute maternal uterine or adnexal abnormality identified. Electronically Signed   By: Rise MuBenjamin  McClintock M.D.   On: 06/02/2018 02:39   [place] Ob Transvaginal  Result Date: 06/02/2018 CLINICAL DATA:  Initial evaluation for acute vaginal bleeding, early pregnancy. EXAM: OBSTETRIC <14 WK US AND TRANSVAGINAL OB US TECHNIQUE: Both transabdominal and transvaginal ultrasound examinations were performed for complete evaluation of the gestation as well as the maternal uterus, adnexal regions, and pelvic cul-de-sac. Transvaginal technique was performed to assess early pregnancy. COMPARISON:  None. FINDINGS: Intrauterine gestational sac: Single Yolk sac:  Not visualized. Embryo:  Not visualized. Cardiac Activity: N/A Heart Rate: N/A  bpm MSD: 14 mm   6 w   2 d Subchorionic hemorrhage:  None visualized. Maternal uterus/adnexae: Ovaries within normal limits. No  adnexal mass. No free fluid within the pelvis. IMPRESSION: 1. Probable early intrauterine gestational sac, but no yolk sac, fetal pole, or cardiac activity yet visualized. Recommend follow-up quantitative B-HCG levels and follow-up US in 14 days to confirm and assess viability. This recommendation follows SRU consensus guidelines: Diagnostic Criteria for Nonviable Pregnancy Early in the First Trimester. Malva Limes Engl J Med 2013; 409:8119-14; 369:1443-51. 2. No other acute maternal uterine or adnexal abnormality identified. Electronically Signed   By: Rise MuBenjamin  McClintock M.D.   On: 06/02/2018 02:39     MAU Management/MDM: Ordered usual first trimester r/o ectopic labs.   Pelvic exam and cultures done Will check baseline Ultrasound to rule out ectopic.  .   This bleeding/pain can represent a normal pregnancy with bleeding, spontaneous abortion or even an ectopic which can be life-threatening.  The process as listed above helps to determine which of these is present.  Reviewed findings with patient Need to repeat HCG in 48 hours to look for doubling Discussed this could represent normal IUP, threatened miscarriage  Or Ectopic Need to reevaluate HCG then repeat US if needed  ASSESSMENT Pregnancy at 3952w5d by LMP Bleeding in the first trimester Cannot exclude SAB or ectopic pregnancy yet  PLAN Discharge home Plan to repeat HCG level in 48 hours in MAU Saturday Will repeat  Ultrasound in about 7-10 days if HCG levels double appropriately  Ectopic precautions  Pt stable at time of discharge. Encouraged to return here or to other Urgent Care/ED if she develops worsening of symptoms, increase in pain, fever, or other concerning symptoms.    Wynelle BourgeoisMarie Williams CNM, MSN Certified Nurse-Midwife 06/02/2018  1:06 AM

## 2018-06-04 ENCOUNTER — Inpatient Hospital Stay (HOSPITAL_COMMUNITY)
Admission: AD | Admit: 2018-06-04 | Discharge: 2018-06-04 | Disposition: A | Discharge status: Home / Self Care | Payer: Medicaid Other | Attending: Family Medicine | Admitting: Family Medicine

## 2018-06-04 ENCOUNTER — Encounter (HOSPITAL_COMMUNITY): Payer: Self-pay | Admitting: Student

## 2018-06-04 ENCOUNTER — Other Ambulatory Visit: Payer: Self-pay

## 2018-06-04 DIAGNOSIS — O039 Complete or unspecified spontaneous abortion without complication: Secondary | ICD-10-CM | POA: Insufficient documentation

## 2018-06-04 LAB — HCG, QUANTITATIVE, PREGNANCY: hCG, Beta Chain, Quant, S: 1021 m[IU]/mL — ABNORMAL HIGH (ref <5)

## 2018-06-04 MED ORDER — ACETAMINOPHEN 500 MG PO TABS: 1000.0000 mg | ORAL_TABLET | Freq: Three times a day (TID) | ORAL | 0 refills | Status: DC | PRN

## 2018-06-04 NOTE — MAU Note (Signed)
Hannah Sherman is a 18 y.o. at [redacted]w[redacted]d here in MAU reporting: here for follow up HCG, was having some increased pain and bleeding yesterday. Today she is wearing a pad but has not had to change it since waking up, states the bleeding is heavier then when she was here the other day. Having some slight pain.  Onset of complaint: ongoing   Pain score: 4/10  Vitals:   06/04/18 0948  BP: (!) 142/103  Pulse: 76  Resp: 18  Temp: 98.3 F (36.8 C)  SpO2: 100%      Lab orders placed from triage: hcg order released

## 2018-06-04 NOTE — Discharge Instructions (Signed)
Miscarriage  A miscarriage is the loss of an unborn baby (fetus) before the 20th week of pregnancy. Most miscarriages happen during the first 3 months of pregnancy. Sometimes, a miscarriage can happen before a woman knows that she is pregnant.  Having a miscarriage can be an emotional experience. If you have had a miscarriage, talk with your health care provider about any questions you may have about miscarrying, the grieving process, and your plans for future pregnancy.  What are the causes?  A miscarriage may be caused by:  · Problems with the genes or chromosomes of the fetus. These problems make it impossible for the baby to develop normally. They are often the result of random errors that occur early in the development of the baby, and are not passed from parent to child (not inherited).  · Infection of the cervix or uterus.  · Conditions that affect hormone balance in the body.  · Problems with the cervix, such as the cervix opening and thinning before pregnancy is at term (cervical insufficiency).  · Problems with the uterus. These may include:  ? A uterus with an abnormal shape.  ? Fibroids in the uterus.  ? Congenital abnormalities. These are problems that were present at birth.  · Certain medical conditions.  · Smoking, drinking alcohol, or using drugs.  · Injury (trauma).  In many cases, the cause of a miscarriage is not known.  What are the signs or symptoms?  Symptoms of this condition include:  · Vaginal bleeding or spotting, with or without cramps or pain.  · Pain or cramping in the abdomen or lower back.  · Passing fluid, tissue, or blood clots from the vagina.  How is this diagnosed?  This condition may be diagnosed based on:  · A physical exam.  · Ultrasound.  · Blood tests.  · Urine tests.  How is this treated?  Treatment for a miscarriage is sometimes not necessary if you naturally pass all the tissue that was in your uterus. If necessary, this condition may be treated with:  · Dilation and  curettage (D&C). This is a procedure in which the cervix is stretched open and the lining of the uterus (endometrium) is scraped. This is done only if tissue from the fetus or placenta remains in the body (incomplete miscarriage).  · Medicines, such as:  ? Antibiotic medicine, to treat infection.  ? Medicine to help the body pass any remaining tissue.  ? Medicine to reduce (contract) the size of the uterus. These medicines may be given if you have a lot of bleeding.  If you have Rh negative blood and your baby was Rh positive, you will need a shot of a medicine called Rh immunoglobulinto protect your future babies from Rh blood problems. "Rh-negative" and "Rh-positive" refer to whether or not the blood has a specific protein found on the surface of red blood cells (Rh factor).  Follow these instructions at home:  Medicines    · Take over-the-counter and prescription medicines only as told by your health care provider.  · If you were prescribed antibiotic medicine, take it as told by your health care provider. Do not stop taking the antibiotic even if you start to feel better.  · Do not take NSAIDs, such as aspirin and ibuprofen, unless they are approved by your health care provider. These medicines can cause bleeding.  Activity  · Rest as directed. Ask your health care provider what activities are safe for you.  · Have someone   help with home and family responsibilities during this time.  General instructions  · Keep track of the number of sanitary pads you use each day and how soaked (saturated) they are. Write down this information.  · Monitor the amount of tissue or blood clots that you pass from your vagina. Save any large amounts of tissue for your health care provider to examine.  · Do not use tampons, douche, or have sex until your health care provider approves.  · To help you and your partner with the process of grieving, talk with your health care provider or seek counseling.  · When you are ready, meet with  your health care provider to discuss any important steps you should take for your health. Also, discuss steps you should take to have a healthy pregnancy in the future.  · Keep all follow-up visits as told by your health care provider. This is important.  Where to find more information  · The American Congress of Obstetricians and Gynecologists: www.acog.org  · U.S. Department of Health and Human Services Office of Women’s Health: www.womenshealth.gov  Contact a health care provider if:  · You have a fever or chills.  · You have a foul smelling vaginal discharge.  · You have more bleeding instead of less.  Get help right away if:  · You have severe cramps or pain in your back or abdomen.  · You pass blood clots or tissue from your vagina that is walnut-sized or larger.  · You soak more than 1 regular sanitary pad in an hour.  · You become light-headed or weak.  · You pass out.  · You have feelings of sadness that take over your thoughts, or you have thoughts of hurting yourself.  Summary  · Most miscarriages happen in the first 3 months of pregnancy. Sometimes miscarriage happens before a woman even knows that she is pregnant.  · Follow your health care provider's instruction for home care. Keep all follow-up appointments.  · To help you and your partner with the process of grieving, talk with your health care provider or seek counseling.  This information is not intended to replace advice given to you by your health care provider. Make sure you discuss any questions you have with your health care provider.  Document Released: 08/05/2000 Document Revised: 03/17/2016 Document Reviewed: 03/17/2016  Elsevier Interactive Patient Education © 2019 Elsevier Inc.

## 2018-06-04 NOTE — MAU Note (Signed)
Phlebotomy in triage to redraw hcg, pt okay to leave after blood draw per NP.

## 2018-06-04 NOTE — MAU Provider Note (Signed)
[name]  is a 18 y.o. G1P0000  at [redacted]w[redacted]d who presents to MAU today for follow-up quant hCG. Reports some mild abdominal cramping & bleeding. Not as bad as when she was seen 2 days ago.   BP (!) 142/103 (BP Location: Right Arm)   Pulse 76   Temp 98.3 F (36.8 C) (Oral)   Resp 18   Ht 5' 4.5" (1.638 m)   Wt 132.1 kg   LMP 04/02/2018 (Approximate)   SpO2 100%   BMI 49.21 kg/m   GENERAL: Well-developed, well-nourished female in no acute distress.  HEENT: Normocephalic, atraumatic.   LUNGS: Effort normal HEART: Regular rate  SKIN: Warm, dry and without erythema PSYCH: Normal mood and affect   A: 1. Miscarriage   ->50% drop in HCG Lab Results  Component Value Date   HCGBETAQNT 1,021 (H) 06/04/2018   HCGBETAQNT 3,277 (H) 06/02/2018  -RH positive   P: Discharge home Bleeding/infection precautions reviewed Msg sent to [facility] for f/u non stat HCG & miscarriage appointment Patient may return to MAU as needed or if her condition were to change or worsen   Judeth Horn, NP  06/04/2018 11:50 AM

## 2018-06-15 ENCOUNTER — Encounter: Payer: Medicaid Other | Admitting: Certified Nurse Midwife

## 2018-06-16 ENCOUNTER — Telehealth: Payer: Self-pay | Admitting: Obstetrics & Gynecology

## 2018-06-16 NOTE — Telephone Encounter (Signed)
Called the patient to inform of upcoming appointments. Also emailed information for webex and location via mychart. The patient verbalized understanding.

## 2018-06-17 ENCOUNTER — Other Ambulatory Visit: Payer: Medicaid Other

## 2018-06-17 ENCOUNTER — Other Ambulatory Visit: Payer: Self-pay | Admitting: *Deleted

## 2018-06-17 DIAGNOSIS — O039 Complete or unspecified spontaneous abortion without complication: Secondary | ICD-10-CM

## 2018-06-21 NOTE — Telephone Encounter (Signed)
Opened in error

## 2018-06-24 ENCOUNTER — Ambulatory Visit: Payer: Medicaid Other

## 2018-07-14 ENCOUNTER — Ambulatory Visit (HOSPITAL_COMMUNITY)
Admission: EM | Admit: 2018-07-14 | Discharge: 2018-07-14 | Disposition: A | Discharge status: Home / Self Care | Payer: Medicaid Other | Attending: Family Medicine | Admitting: Family Medicine

## 2018-07-14 ENCOUNTER — Other Ambulatory Visit: Payer: Self-pay

## 2018-07-14 ENCOUNTER — Encounter (HOSPITAL_COMMUNITY): Payer: Self-pay

## 2018-07-14 DIAGNOSIS — Z8759 Personal history of other complications of pregnancy, childbirth and the puerperium: Secondary | ICD-10-CM | POA: Diagnosis not present

## 2018-07-14 DIAGNOSIS — N939 Abnormal uterine and vaginal bleeding, unspecified: Secondary | ICD-10-CM | POA: Diagnosis not present

## 2018-07-14 DIAGNOSIS — Z3202 Encounter for pregnancy test, result negative: Secondary | ICD-10-CM | POA: Diagnosis not present

## 2018-07-14 LAB — POCT URINALYSIS DIP (DEVICE)
Bilirubin Urine: NEGATIVE
Glucose, UA: NEGATIVE mg/dL
Ketones, ur: NEGATIVE mg/dL
Nitrite: NEGATIVE
Protein, ur: 30 mg/dL — AB
Specific Gravity, Urine: 1.025 (ref 1.005–1.030)
Urobilinogen, UA: 2 mg/dL — ABNORMAL HIGH (ref 0.0–1.0)
pH: 6 (ref 5.0–8.0)

## 2018-07-14 LAB — HCG, QUANTITATIVE, PREGNANCY: hCG, Beta Chain, Quant, S: <1 m[IU]/mL (ref <5)

## 2018-07-14 LAB — POCT PREGNANCY, URINE: Preg Test, Ur: NEGATIVE

## 2018-07-14 MED ORDER — ONDANSETRON HCL 4 MG PO TABS: 4.0000 mg | ORAL_TABLET | Freq: Four times a day (QID) | ORAL | 0 refills | Status: DC

## 2018-07-14 MED ORDER — NAPROXEN 500 MG PO TABS: 500.0000 mg | ORAL_TABLET | Freq: Two times a day (BID) | ORAL | 0 refills | Status: DC
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
## 2018-07-14 NOTE — ED Triage Notes (Signed)
Pt presents with generalized abdominal pain and nausea; pt states she is recovering from miscarrying on the 11th of this month.

## 2018-07-14 NOTE — Discharge Instructions (Addendum)
Take Naprosyn as needed for additional pain relief. Follow up w/ OB/GYN. Go to ER if bleeding or pain becomes severe, you become weak, dizzy/lightheaded, or develop chest pain for difficulty catching your breath.

## 2018-07-17 ENCOUNTER — Telehealth (HOSPITAL_COMMUNITY): Payer: Self-pay | Admitting: Emergency Medicine

## 2018-07-17 NOTE — Telephone Encounter (Signed)
Patient contacted and made aware of all results, all questions answered.   

## 2018-07-31 ENCOUNTER — Telehealth: Payer: Self-pay

## 2018-07-31 NOTE — Telephone Encounter (Signed)
Patient called through [facility] stating her concern regarding the presence of  "covid symptoms."  Although she has asthma she reports a new onset of "difficulty breathing."  She also reports diarrhea and runny nose.  Advised patient to visit ED if her breathing worsens and does not improve after usage of her inhaler.  Also advised patient to increase fluids and monitor for signs of dehydration including weakness related to her diarrhea.  Patient currently does not have insurance and does not have a PCP.

## 2018-08-04 ENCOUNTER — Other Ambulatory Visit: Payer: Self-pay

## 2018-08-04 ENCOUNTER — Ambulatory Visit (HOSPITAL_COMMUNITY)
Admission: EM | Admit: 2018-08-04 | Discharge: 2018-08-04 | Disposition: A | Discharge status: Home / Self Care | Payer: Medicaid Other | Attending: Internal Medicine | Admitting: Internal Medicine

## 2018-08-04 ENCOUNTER — Encounter (HOSPITAL_COMMUNITY): Payer: Self-pay

## 2018-08-04 DIAGNOSIS — N3001 Acute cystitis with hematuria: Secondary | ICD-10-CM | POA: Insufficient documentation

## 2018-08-04 DIAGNOSIS — R8290 Unspecified abnormal findings in urine: Secondary | ICD-10-CM | POA: Insufficient documentation

## 2018-08-04 DIAGNOSIS — Z3202 Encounter for pregnancy test, result negative: Secondary | ICD-10-CM | POA: Diagnosis not present

## 2018-08-04 DIAGNOSIS — R110 Nausea: Secondary | ICD-10-CM | POA: Diagnosis not present

## 2018-08-04 LAB — POCT URINALYSIS DIP (DEVICE)
Bilirubin Urine: NEGATIVE
Glucose, UA: NEGATIVE mg/dL
Ketones, ur: NEGATIVE mg/dL
Leukocytes,Ua: NEGATIVE
Nitrite: POSITIVE — AB
Protein, ur: NEGATIVE mg/dL
Specific Gravity, Urine: 1.025 (ref 1.005–1.030)
Urobilinogen, UA: 0.2 mg/dL (ref 0.0–1.0)
pH: 7 (ref 5.0–8.0)

## 2018-08-04 LAB — POCT PREGNANCY, URINE: Preg Test, Ur: NEGATIVE

## 2018-08-04 MED ORDER — ONDANSETRON HCL 4 MG PO TABS: 4.0000 mg | ORAL_TABLET | Freq: Four times a day (QID) | ORAL | 0 refills | Status: DC

## 2018-08-04 MED ORDER — CEPHALEXIN 500 MG PO CAPS: 500.0000 mg | ORAL_CAPSULE | Freq: Three times a day (TID) | ORAL | 0 refills | Status: AC

## 2018-08-04 NOTE — ED Provider Notes (Addendum)
[place]    CSN: [id number] Arrival date & time: 08/04/18  4315     History   Chief Complaint Chief Complaint  Patient presents with  . Urinary Tract Infection    HPI Hannah Sherman is a 18 y.o. female presenting for acute concern of UTI.  Patient states she has had cloudy urine, discomfort for the last 3 days.  Patient denies flank/abdominal pain, burning/stinging with urination, urinary frequency, urgency or hematuria; "it just feels weird when I go".  Patient also requesting urine pregnancy test due to 2-day course of intermittent nausea.  Patient states that this "comes and goes ".  No known aggravating or alleviating factors.  Patient denies fever, malaise, recent illness, vomiting, abdominal pain, pelvic pain, vaginal pain, vaginal discharge or malodor.  Currently sexually active one female partner, not using condoms.  LMP 5/20.   Past Medical History:  Diagnosis Date  . Asthma     Patient Active Problem List   Diagnosis Date Noted  . Bleeding in early pregnancy 06/02/2018    Past Surgical History:  Procedure Laterality Date  . ABDOMINAL SURGERY    . TONSILLECTOMY AND ADENOIDECTOMY  2003    OB History    Gravida  1   Para  0   Term  0   Preterm  0   AB  0   Living  0     SAB  0   TAB  0   Ectopic  0   Multiple  0   Live Births               Home Medications    Prior to Admission medications   Medication Sig Start Date End Date Taking? Authorizing Provider  acetaminophen (TYLENOL) 500 MG tablet Take 2 tablets (1,000 mg total) by mouth every 8 (eight) hours as needed for up to 30 doses. 06/04/18   Jorje Guild, NP  albuterol (PROVENTIL HFA;VENTOLIN HFA) 108 (90 Base) MCG/ACT inhaler Inhale 1 puff into the lungs every 6 (six) hours as needed for wheezing or shortness of breath.    [provider]  cephALEXin (KEFLEX) 500 MG capsule Take 1 capsule (500 mg total) by mouth 3 (three) times daily for 3 days. 08/04/18  08/07/18  Hall-Potvin, [place], PA-C  naproxen (NAPROSYN) 500 MG tablet Take 1 tablet (500 mg total) by mouth 2 (two) times daily. 07/14/18   Hall-Potvin, [place], PA-C  ondansetron (ZOFRAN) 4 MG tablet Take 1 tablet (4 mg total) by mouth every 6 (six) hours. 08/04/18   Hall-Potvin, [place], PA-C  Prenatal Vit-Fe Fumarate-FA (PRENATAL PO) Take 1 tablet by mouth daily.    [provider]    Family History Family History  Problem Relation Age of Onset  . Hypertension Maternal Grandmother   . Healthy Mother   . Healthy Father     Social History Social History   Tobacco Use  . Smoking status: Never Smoker  . Smokeless tobacco: Never Used  Substance Use Topics  . Alcohol use: No    Alcohol/week: 0.0 standard drinks  . Drug use: No     Allergies   Patient has no known allergies.   Review of Systems As per HPI   Physical Exam Triage Vital Signs ED Triage Vitals  Enc Vitals Group     BP      Pulse      Resp      Temp      Temp src      SpO2  Weight      Height      Head Circumference      Peak Flow      Pain Score      Pain Loc      Pain Edu?      Excl. in GC?    No data found.  Updated Vital Signs BP 133/89 (BP Location: Right Arm)   Pulse 100   Temp 98.4 F (36.9 C) (Oral)   Resp 18   Wt 293 lb (132.9 kg)   LMP 07/13/2018   SpO2 97%   BMI 49.52 kg/m   Visual Acuity Right Eye Distance:   Left Eye Distance:   Bilateral Distance:    Right Eye Near:   Left Eye Near:    Bilateral Near:     Physical Exam Vitals signs reviewed.  Constitutional:      General: She is not in acute distress. HENT:     Head: Normocephalic and atraumatic.  Eyes:     General: No scleral icterus.    Pupils: Pupils are equal, round, and reactive to light.  Cardiovascular:     Rate and Rhythm: Normal rate.  Pulmonary:     Effort: Pulmonary effort is normal.  Abdominal:     General: Abdomen is flat. Bowel sounds are normal.     Palpations: Abdomen is  soft.     Tenderness: There is no abdominal tenderness. There is no right CVA tenderness, left CVA tenderness or guarding.  Genitourinary:    Comments: Pt declined Skin:    Coloration: Skin is not jaundiced or pale.  Neurological:     Mental Status: She is alert and oriented to person, place, and time.      UC Treatments / Results  Labs (all labs ordered are listed, but only abnormal results are displayed) Labs Reviewed  POCT URINALYSIS DIP (DEVICE) - Abnormal; Notable for the following components:      Result Value   Hgb urine dipstick TRACE (*)    Nitrite POSITIVE (*)    All other components within normal limits  URINE CULTURE  POC URINE PREG, ED  POCT PREGNANCY, URINE    EKG None  Radiology No results found.  Procedures Procedures (including critical care time)  Medications Ordered in UC Medications - No data to display  Initial Impression / Assessment and Plan / UC Course  I have reviewed the triage vital signs and the nursing notes.  Pertinent labs & imaging results that were available during my care of the patient were reviewed by me and considered in my medical decision making (see chart for details).     18 year old female presenting for acute concern of UTI.  POCT urinalysis showing trace hemoglobin and nitrates, will send for culture.  Patient electing to start antibiotics today: We will change antibiotic if culture indicates.  Pregnancy test negative, encourage patient to follow-up with PCP. Final Clinical Impressions(s) / UC Diagnoses   Final diagnoses:  Nausea without vomiting  Pregnancy examination or test, negative result  Cloudy urine  Acute cystitis with hematuria     Discharge Instructions     Urine culture pending, you can look for your results on MyChart. We will change antibiotic if necessary.    ED Prescriptions    Medication Sig Dispense Auth. Provider   ondansetron (ZOFRAN) 4 MG tablet Take 1 tablet (4 mg total) by mouth every 6  (six) hours. 12 tablet Hall-Potvin, [place], PA-C   cephALEXin (KEFLEX) 500 MG capsule Take 1 capsule (  500 mg total) by mouth 3 (three) times daily for 3 days. 9 capsule Hall-Potvin, [place], PA-C     Controlled Substance Prescriptions [place] Controlled Substance Registry consulted? Not Applicable   Shea EvansHall-Potvin, Brittany, PA-C 08/04/18 1046    Hall-Potvin, [place], [place]-C 08/04/18 1046

## 2018-08-04 NOTE — Discharge Instructions (Addendum)
Urine culture pending, you can look for your results on MyChart. We will change antibiotic if necessary.

## 2018-08-04 NOTE — ED Triage Notes (Signed)
Pt states she thinks she has a UTI. X 3 days.  Pt would like to do a pregnancy test.

## 2018-08-06 LAB — URINE CULTURE: Culture: >=100000 — AB

## 2018-08-08 ENCOUNTER — Telehealth (HOSPITAL_COMMUNITY): Payer: Self-pay | Admitting: Emergency Medicine

## 2018-08-08 NOTE — Telephone Encounter (Signed)
Urine culture was positive for e coli and was given keflex at urgent care visit. Pt contacted and made aware, educated on completing antibiotic and to follow up if symptoms are persistent. Verbalized understanding.    

## 2018-08-24 ENCOUNTER — Encounter (HOSPITAL_COMMUNITY): Payer: Self-pay | Admitting: Emergency Medicine

## 2018-08-24 ENCOUNTER — Ambulatory Visit (HOSPITAL_COMMUNITY)
Admission: EM | Admit: 2018-08-24 | Discharge: 2018-08-24 | Disposition: A | Discharge status: Home / Self Care | Payer: Medicaid Other | Attending: Internal Medicine | Admitting: Internal Medicine

## 2018-08-24 ENCOUNTER — Other Ambulatory Visit: Payer: Self-pay

## 2018-08-24 DIAGNOSIS — Z3201 Encounter for pregnancy test, result positive: Secondary | ICD-10-CM

## 2018-08-24 DIAGNOSIS — R5383 Other fatigue: Secondary | ICD-10-CM | POA: Diagnosis not present

## 2018-08-24 DIAGNOSIS — N912 Amenorrhea, unspecified: Secondary | ICD-10-CM

## 2018-08-24 LAB — POCT PREGNANCY, URINE: Preg Test, Ur: POSITIVE — AB

## 2018-08-24 NOTE — ED Triage Notes (Signed)
PT reports increased fatigue, cough, runny nose, and someone in her home had a negative covid test. Menstrual is late and only spotting. Is concerned about COVID or possibly pregnant.

## 2018-08-24 NOTE — ED Provider Notes (Signed)
[place]    CSN: [id number] Arrival date & time: 08/24/18  1342     History   Chief Complaint Chief Complaint  Patient presents with  . Amenorrhea  . Fatigue    HPI Hannah Sherman is a 18 y.o. female a history of asthma comes to urgent care with complaints of increasing fatigue and amenorrhea.   Menstrual.  Has been late she has been spotting.  She also reports increasing fatigue, cough, runny nose.  A household member recently tested negative for COVID-19.  No nausea or vomiting.  HPI  Past Medical History:  Diagnosis Date  . Asthma     Patient Active Problem List   Diagnosis Date Noted  . Bleeding in early pregnancy 06/02/2018    Past Surgical History:  Procedure Laterality Date  . ABDOMINAL SURGERY    . TONSILLECTOMY AND ADENOIDECTOMY  2003    OB History    Gravida  1   Para  0   Term  0   Preterm  0   AB  0   Living  0     SAB  0   TAB  0   Ectopic  0   Multiple  0   Live Births               Home Medications    Prior to Admission medications   Medication Sig Start Date End Date Taking? Authorizing Provider  acetaminophen (TYLENOL) 500 MG tablet Take 2 tablets (1,000 mg total) by mouth every 8 (eight) hours as needed for up to 30 doses. 06/04/18   Jorje Guild, NP  albuterol (PROVENTIL HFA;VENTOLIN HFA) 108 (90 Base) MCG/ACT inhaler Inhale 1 puff into the lungs every 6 (six) hours as needed for wheezing or shortness of breath.    [provider]  ondansetron (ZOFRAN) 4 MG tablet Take 1 tablet (4 mg total) by mouth every 6 (six) hours. 08/04/18   Hall-Potvin, [place], PA-C  Prenatal Vit-Fe Fumarate-FA (PRENATAL PO) Take 1 tablet by mouth daily.    [provider]    Family History Family History  Problem Relation Age of Onset  . Hypertension Maternal Grandmother   . Healthy Mother   . Healthy Father     Social History Social History   Tobacco Use  . Smoking status: Never Smoker  .  Smokeless tobacco: Never Used  Substance Use Topics  . Alcohol use: No    Alcohol/week: 0.0 standard drinks  . Drug use: No     Allergies   Patient has no known allergies.   Review of Systems Review of Systems  Constitutional: Negative.   Respiratory: Negative for cough, shortness of breath and wheezing.   Cardiovascular: Negative for chest pain.  Gastrointestinal: Negative.   Genitourinary: Negative for dyspareunia, dysuria, flank pain, frequency and urgency.     Physical Exam Triage Vital Signs ED Triage Vitals  Enc Vitals Group     BP 08/24/18 1449 (!) 115/51     Pulse Rate 08/24/18 1449 75     Resp 08/24/18 1449 16     Temp 08/24/18 1449 98.2 F (36.8 C)     Temp Source 08/24/18 1449 Oral     SpO2 08/24/18 1449 100 %     Weight --      Height --      Head Circumference --      Peak Flow --      Pain Score 08/24/18 1447 0     Pain  Loc --      Pain Edu? --      Excl. in GC? --    No data found.  Updated Vital Signs BP (!) 115/51   Pulse 75   Temp 98.2 F (36.8 C) (Oral)   Resp 16   LMP 07/12/2018   SpO2 100%   Visual Acuity Right Eye Distance:   Left Eye Distance:   Bilateral Distance:    Right Eye Near:   Left Eye Near:    Bilateral Near:     Physical Exam Constitutional:      General: She is not in acute distress.    Appearance: She is obese. She is not ill-appearing or toxic-appearing.  Cardiovascular:     Rate and Rhythm: Normal rate and regular rhythm.     Pulses: Normal pulses.  Abdominal:     General: Bowel sounds are normal.     Palpations: Abdomen is soft.  Skin:    General: Skin is warm.     Capillary Refill: Capillary refill takes less than 2 seconds.  Neurological:     General: No focal deficit present.     Mental Status: She is alert and oriented to person, place, and time.      UC Treatments / Results  Labs (all labs ordered are listed, but only abnormal results are displayed) Labs Reviewed  POCT PREGNANCY, URINE -  Abnormal; Notable for the following components:      Result Value   Preg Test, Ur POSITIVE (*)    All other components within normal limits  POC URINE PREG, ED    EKG   Radiology No results found.  Procedures Procedures (including critical care time)  Medications Ordered in UC Medications - No data to display  Initial Impression / Assessment and Plan / UC Course  I have reviewed the triage vital signs and the nursing notes.  Pertinent labs & imaging results that were available during my care of the patient were reviewed by me and considered in my medical decision making (see chart for details).     1.  Concern for pregnancy: Positive pregnancy test Patient advised to start prenatal vitamins Patient has OB/GYN appointment in the next week  2.  Concern for COVID-19 testing: I offered the patient COVID-19 testing but she declined.  Final Clinical Impressions(s) / UC Diagnoses   Final diagnoses:  Positive pregnancy test   Discharge Instructions   None    ED Prescriptions    None     Controlled Substance Prescriptions [place] Controlled Substance Registry consulted? No   Merrilee JanskyLamptey, Tarus Briski O, MD 08/25/18 2005

## 2018-10-28 ENCOUNTER — Inpatient Hospital Stay (HOSPITAL_COMMUNITY)
Admission: AD | Admit: 2018-10-28 | Discharge: 2018-10-28 | Disposition: A | Discharge status: Home / Self Care | Payer: Medicaid Other | Attending: Family Medicine | Admitting: Family Medicine

## 2018-10-28 ENCOUNTER — Encounter (HOSPITAL_COMMUNITY): Payer: Self-pay | Admitting: Student

## 2018-10-28 ENCOUNTER — Other Ambulatory Visit: Payer: Self-pay

## 2018-10-28 DIAGNOSIS — Z3A15 15 weeks gestation of pregnancy: Secondary | ICD-10-CM | POA: Insufficient documentation

## 2018-10-28 DIAGNOSIS — O2342 Unspecified infection of urinary tract in pregnancy, second trimester: Secondary | ICD-10-CM

## 2018-10-28 DIAGNOSIS — O23592 Infection of other part of genital tract in pregnancy, second trimester: Secondary | ICD-10-CM | POA: Diagnosis not present

## 2018-10-28 DIAGNOSIS — R102 Pelvic and perineal pain: Secondary | ICD-10-CM | POA: Diagnosis present

## 2018-10-28 DIAGNOSIS — N760 Acute vaginitis: Secondary | ICD-10-CM | POA: Diagnosis not present

## 2018-10-28 DIAGNOSIS — B9689 Other specified bacterial agents as the cause of diseases classified elsewhere: Secondary | ICD-10-CM | POA: Diagnosis not present

## 2018-10-28 LAB — URINALYSIS, ROUTINE W REFLEX MICROSCOPIC
Bilirubin Urine: NEGATIVE
Glucose, UA: NEGATIVE mg/dL
Hgb urine dipstick: NEGATIVE
Ketones, ur: 80 mg/dL — AB
Nitrite: NEGATIVE
Protein, ur: 30 mg/dL — AB
Specific Gravity, Urine: 1.027 (ref 1.005–1.030)
pH: 7 (ref 5.0–8.0)

## 2018-10-28 LAB — WET PREP, GENITAL
Sperm: NONE SEEN
Trich, Wet Prep: NONE SEEN
Yeast Wet Prep HPF POC: NONE SEEN

## 2018-10-28 MED ORDER — NITROFURANTOIN MONOHYD MACRO 100 MG PO CAPS: 100.0000 mg | ORAL_CAPSULE | Freq: Two times a day (BID) | ORAL | 0 refills | Status: DC

## 2018-10-28 MED ORDER — METRONIDAZOLE 500 MG PO TABS: 500.0000 mg | ORAL_TABLET | Freq: Two times a day (BID) | ORAL | 0 refills | Status: DC

## 2018-10-28 NOTE — MAU Provider Note (Signed)
Chief Complaint: Abdominal Pain and Pelvic Pain   First Provider Initiated Contact with Patient 10/28/18 1207     SUBJECTIVE HPI: Hannah Sherman is a 18 y.o. G2P0010 at 2957w3d who presents to Maternity Admissions reporting pelvic pain. Symptoms started yesterday. Reports intermittent pelvic/vaginal pain that feels like shooting/punching. Also reports urinary urgency & malodorous urine. Feels like she has a UTI. Had a UTI in June but thinks she lost the rx & was never treated. Denies n/v/d, fever/chills, vaginal bleeding, recent intercourse, flank pain, or vaginal discharge. No prenatal care yet but [facility]   Location: pelvic Quality: shooting Severity: 5/10 on pain scale Duration: 2 days Timing: intermittent Modifying factors: none Associated signs and symptoms: urinary urgency  Past Medical History:  Diagnosis Date  . Asthma    OB History  Gravida Para Term Preterm AB Living  2 0 0 0 1 0  SAB TAB Ectopic Multiple Live Births  1 0 0 0      # Outcome Date GA Lbr Len/2nd Weight Sex Delivery Anes PTL Lv  2 Current           1 SAB 05/2018           Past Surgical History:  Procedure Laterality Date  . ABDOMINAL SURGERY    . TONSILLECTOMY AND ADENOIDECTOMY  2003   Social History   Socioeconomic History  . Marital status: Single    Spouse name: Not on file  . Number of children: Not on file  . Years of education: Not on file  . Highest education level: Not on file  Occupational History  . Not on file  Social Needs  . Financial resource strain: Not on file  . Food insecurity    Worry: Not on file    Inability: Not on file  . Transportation needs    Medical: Not on file    Non-medical: Not on file  Tobacco Use  . Smoking status: Never Smoker  . Smokeless tobacco: Never Used  Substance and Sexual Activity  . Alcohol use: No    Alcohol/week: 0.0 standard drinks  . Drug use: No  . Sexual activity: Not Currently    Birth control/protection:  None  Lifestyle  . Physical activity    Days per week: Not on file    Minutes per session: Not on file  . Stress: Not on file  Relationships  . Social Musicianconnections    Talks on phone: Not on file    Gets together: Not on file    Attends religious service: Not on file    Active member of club or organization: Not on file    Attends meetings of clubs or organizations: Not on file    Relationship status: Not on file  . Intimate partner violence    Fear of current or ex partner: Not on file    Emotionally abused: Not on file    Physically abused: Not on file    Forced sexual activity: Not on file  Other Topics Concern  . Not on file  Social History Narrative  . Not on file   Family History  Problem Relation Age of Onset  . Hypertension Maternal Grandmother   . Healthy Mother   . Healthy Father    No current facility-administered medications on file prior to encounter.    Current Outpatient Medications on File Prior to Encounter  Medication Sig Dispense Refill  . acetaminophen (TYLENOL) 500 MG tablet Take 2 tablets (  1,000 mg total) by mouth every 8 (eight) hours as needed for up to 30 doses. 60 tablet 0  . Prenatal Vit-Fe Fumarate-FA (PRENATAL PO) Take 1 tablet by mouth daily.    Marland Kitchen albuterol (PROVENTIL HFA;VENTOLIN HFA) 108 (90 Base) MCG/ACT inhaler Inhale 1 puff into the lungs every 6 (six) hours as needed for wheezing or shortness of breath.    . ondansetron (ZOFRAN) 4 MG tablet Take 1 tablet (4 mg total) by mouth every 6 (six) hours. 12 tablet 0   No Known Allergies  I have reviewed patient's Past Medical Hx, Surgical Hx, Family Hx, Social Hx, medications and allergies.   Review of Systems  Constitutional: Negative.   Gastrointestinal: Positive for abdominal pain.  Genitourinary: Positive for pelvic pain and urgency. Negative for dysuria, flank pain and frequency.    OBJECTIVE Patient Vitals for the past 24 hrs:  BP Temp Temp src Pulse Resp SpO2 Height Weight  10/28/18  1312 (!) 122/52 - - - - - - -  10/28/18 1129 (!) 124/58 98.5 F (36.9 C) Oral 67 18 100 % 5' 4.5" (1.638 m) 129.9 kg   Constitutional: Well-developed, well-nourished female in no acute distress.  Cardiovascular: normal rate & rhythm, no murmur Respiratory: normal rate and effort. Lung sounds clear throughout GI: Abd soft, non-tender, Pos BS x 4. No guarding or rebound tenderness. No CVAT MS: Extremities nontender, no edema, normal ROM Neurologic: Alert and oriented x 4.  GU: cervix closed/thick. Vaginal swabs collected blindly; patient barely tolerant of exams   LAB RESULTS Results for orders placed or performed during the hospital encounter of 10/28/18 (from the past 24 hour(s))  Urinalysis, Routine w reflex microscopic     Status: Abnormal   Collection Time: 10/28/18 11:35 AM  Result Value Ref Range   Color, Urine AMBER (A) YELLOW   APPearance HAZY (A) CLEAR   Specific Gravity, Urine 1.027 1.005 - 1.030   pH 7.0 5.0 - 8.0   Glucose, UA NEGATIVE NEGATIVE mg/dL   Hgb urine dipstick NEGATIVE NEGATIVE   Bilirubin Urine NEGATIVE NEGATIVE   Ketones, ur 80 (A) NEGATIVE mg/dL   Protein, ur 30 (A) NEGATIVE mg/dL   Nitrite NEGATIVE NEGATIVE   Leukocytes,Ua TRACE (A) NEGATIVE   RBC / HPF 0-5 0 - 5 RBC/hpf   WBC, UA 6-10 0 - 5 WBC/hpf   Bacteria, UA MANY (A) NONE SEEN   Squamous Epithelial / LPF 11-20 0 - 5   Mucus PRESENT   Wet prep, genital     Status: Abnormal   Collection Time: 10/28/18 12:34 PM   Specimen: Vaginal  Result Value Ref Range   Yeast Wet Prep HPF POC NONE SEEN NONE SEEN   Trich, Wet Prep NONE SEEN NONE SEEN   Clue Cells Wet Prep HPF POC PRESENT (A) NONE SEEN   WBC, Wet Prep HPF POC FEW (A) NONE SEEN   Sperm NONE SEEN     IMAGING No results found.  MAU COURSE Orders Placed This Encounter  Procedures  . Culture, OB Urine  . Wet prep, genital  . Urinalysis, Routine w reflex microscopic  . Discharge patient   Meds ordered this encounter  Medications  .  nitrofurantoin, macrocrystal-monohydrate, (MACROBID) 100 MG capsule    Sig: Take 1 capsule (100 mg total) by mouth 2 (two) times daily.    Dispense:  14 capsule    Refill:  0    Order Specific Question:   Supervising Provider    Answer:   Darron Doom  S [[id number]]  . metroNIDAZOLE (FLAGYL) 500 MG tablet    Sig: Take 1 tablet (500 mg total) by mouth 2 (two) times daily.    Dispense:  14 tablet    Refill:  0    Order Specific Question:   Supervising Provider    Answer:   Samara Snide    MDM FHT present via doppler  U/a with leuks. Will send for urine culture & tx for UTI based on hx & complaints.   Pt concerned d/t heavy lifting at work (50 lb + boxes). Will provide with work restriction note  Cervix closed Wet prep with clue cells GC/CT pending  ASSESSMENT 1. UTI (urinary tract infection) during pregnancy, second trimester   2. [redacted] weeks gestation of pregnancy   3. BV (bacterial vaginosis)     PLAN Discharge home in stable condition. Discussed reasons to return to MAU Rx flagyl & macrobid Urine culture & GC/CT pending F/u with OB  Follow-up Information    [facility] Follow up.   Specialty: Obstetrics and Gynecology Why: keep scheduled appointment or call office as needed Contact information: [address] [place] [address] [phone]       [facility] Follow up.   Specialty: Obstetrics and Gynecology Why: return for worsening symptoms Contact information: [address] [phone] Wilhemina Bonito [place] [address] [phone]         Allergies as of 10/28/2018   No Known Allergies     Medication List    TAKE these medications   acetaminophen 500 MG tablet Commonly known as: TYLENOL Take 2 tablets (1,000 mg total) by mouth every 8 (eight) hours as needed for up to 30 doses.   albuterol 108 (90 Base) MCG/ACT inhaler Commonly known as: VENTOLIN  HFA Inhale 1 puff into the lungs every 6 (six) hours as needed for wheezing or shortness of breath.   metroNIDAZOLE 500 MG tablet Commonly known as: FLAGYL Take 1 tablet (500 mg total) by mouth 2 (two) times daily.   nitrofurantoin (macrocrystal-monohydrate) 100 MG capsule Commonly known as: MACROBID Take 1 capsule (100 mg total) by mouth 2 (two) times daily.   ondansetron 4 MG tablet Commonly known as: ZOFRAN Take 1 tablet (4 mg total) by mouth every 6 (six) hours.   PRENATAL PO Take 1 tablet by mouth daily.        Judeth Horn, NP 10/28/2018  1:34 PM

## 2018-10-28 NOTE — MAU Note (Addendum)
Been feeling bad vaginal pressure(felt like a punching, started this morning) and cramping in lower abd.  Started last night.  No bleeding.

## 2018-10-28 NOTE — Discharge Instructions (Signed)
Pregnancy and Urinary Tract Infection ° °A urinary tract infection (UTI) is an infection of any part of the urinary tract. This includes the kidneys, the tubes that connect your kidneys to your bladder (ureters), the bladder, and the tube that carries urine out of your body (urethra). These organs make, store, and get rid of urine in the body. Your health care provider may use other names to describe the infection. An upper UTI affects the ureters and kidneys (pyelonephritis). A lower UTI affects the bladder (cystitis) and urethra (urethritis). °Most urinary tract infections are caused by bacteria in your genital area, around the entrance to your urinary tract (urethra). These bacteria grow and cause irritation and inflammation of your urinary tract. You are more likely to develop a UTI during pregnancy because the physical and hormonal changes your body goes through can make it easier for bacteria to get into your urinary tract. Your growing baby also puts pressure on your bladder and can affect urine flow. It is important to recognize and treat UTIs in pregnancy because of the risk of serious complications for both you and your baby. °How does this affect me? °Symptoms of a UTI include: °· Needing to urinate right away (urgently). °· Frequent urination or passing small amounts of urine frequently. °· Pain or burning with urination. °· Blood in the urine. °· Urine that smells bad or unusual. °· Trouble urinating. °· Cloudy urine. °· Pain in the abdomen or lower back. °· Vaginal discharge. °You may also have: °· Vomiting or a decreased appetite. °· Confusion. °· Irritability or tiredness. °· A fever. °· Diarrhea. °How does this affect my baby? °An untreated UTI during pregnancy could lead to a kidney infection or a systemic infection, which can cause health problems that could affect your baby. Possible complications of an untreated UTI include: °· Giving birth to your baby before 37 weeks of pregnancy  (premature). °· Having a baby with a low birth weight. °· Developing high blood pressure during pregnancy (preeclampsia). °· Having a low hemoglobin level (anemia). °What can I do to lower my risk? °To prevent a UTI: °· Go to the bathroom as soon as you feel the need. Do not hold urine for long periods of time. °· Always wipe from front to back, especially after a bowel movement. Use each tissue one time when you wipe. °· Empty your bladder after sex. °· Keep your genital area dry. °· Drink 6-10 glasses of water each day. °· Do not douche or use deodorant sprays. °How is this treated? °Treatment for this condition may include: °· Antibiotic medicines that are safe to take during pregnancy. °· Other medicines to treat less common causes of UTI. °Follow these instructions at home: °· If you were prescribed an antibiotic medicine, take it as told by your health care provider. Do not stop using the antibiotic even if you start to feel better. °· Keep all follow-up visits as told by your health care provider. This is important. °Contact a health care provider if: °· Your symptoms do not improve or they get worse. °· You have abnormal vaginal discharge. °Get help right away if you: °· Have a fever. °· Have nausea and vomiting. °· Have back or side pain. °· Feel contractions in your uterus. °· Have lower belly pain. °· Have a gush of fluid from your vagina. °· Have blood in your urine. °Summary °· A urinary tract infection (UTI) is an infection of any part of the urinary tract, which includes the   kidneys, ureters, bladder, and urethra.  Most urinary tract infections are caused by bacteria in your genital area, around the entrance to your urinary tract (urethra).  You are more likely to develop a UTI during pregnancy.  If you were prescribed an antibiotic medicine, take it as told by your health care provider. Do not stop using the antibiotic even if you start to feel better. This information is not intended to  replace advice given to you by your health care provider. Make sure you discuss any questions you have with your health care provider. Document Released: 06/06/2010 Document Revised: 06/03/2018 Document Reviewed: 01/13/2018 Elsevier Patient Education  2020 [employer].    Bacterial Vaginosis  Bacterial vaginosis is a vaginal infection that occurs when the normal balance of bacteria in the vagina is disrupted. It results from an overgrowth of certain bacteria. This is the most common vaginal infection among women ages 51-44. Because bacterial vaginosis increases your risk for STIs (sexually transmitted infections), getting treated can help reduce your risk for chlamydia, gonorrhea, herpes, and HIV (human immunodeficiency virus). Treatment is also important for preventing complications in pregnant women, because this condition can cause an early (premature) delivery. What are the causes? This condition is caused by an increase in harmful bacteria that are normally present in small amounts in the vagina. However, the reason that the condition develops is not fully understood. What increases the risk? The following factors may make you more likely to develop this condition:  Having a new sexual partner or multiple sexual partners.  Having unprotected sex.  Douching.  Having an intrauterine device (IUD).  Smoking.  Drug and alcohol abuse.  Taking certain antibiotic medicines.  Being pregnant. You cannot get bacterial vaginosis from toilet seats, bedding, swimming pools, or contact with objects around you. What are the signs or symptoms? Symptoms of this condition include:  Grey or white vaginal discharge. The discharge can also be watery or foamy.  A fish-like odor with discharge, especially after sexual intercourse or during menstruation.  Itching in and around the vagina.  Burning or pain with urination. Some women with bacterial vaginosis have no signs or symptoms. How is  this diagnosed? This condition is diagnosed based on:  Your medical history.  A physical exam of the vagina.  Testing a sample of vaginal fluid under a microscope to look for a large amount of bad bacteria or abnormal cells. Your health care provider may use a cotton swab or a small wooden spatula to collect the sample. How is this treated? This condition is treated with antibiotics. These may be given as a pill, a vaginal cream, or a medicine that is put into the vagina (suppository). If the condition comes back after treatment, a second round of antibiotics may be needed. Follow these instructions at home: Medicines  Take over-the-counter and prescription medicines only as told by your health care provider.  Take or use your antibiotic as told by your health care provider. Do not stop taking or using the antibiotic even if you start to feel better. General instructions  If you have a female sexual partner, tell her that you have a vaginal infection. She should see her health care provider and be treated if she has symptoms. If you have a female sexual partner, he does not need treatment.  During treatment: ? Avoid sexual activity until you finish treatment. ? Do not douche. ? Avoid alcohol as directed by your health care provider. ? Avoid breastfeeding as directed by your  health care provider.  Drink enough water and fluids to keep your urine clear or pale yellow.  Keep the area around your vagina and rectum clean. ? Wash the area daily with warm water. ? Wipe yourself from front to back after using the toilet.  Keep all follow-up visits as told by your health care provider. This is important. How is this prevented?  Do not douche.  Wash the outside of your vagina with warm water only.  Use protection when having sex. This includes latex condoms and dental dams.  Limit how many sexual partners you have. To help prevent bacterial vaginosis, it is best to have sex with just one  partner (monogamous).  Make sure you and your sexual partner are tested for STIs.  Wear cotton or cotton-lined underwear.  Avoid wearing tight pants and pantyhose, especially during summer.  Limit the amount of alcohol that you drink.  Do not use any products that contain nicotine or tobacco, such as cigarettes and e-cigarettes. If you need help quitting, ask your health care provider.  Do not use illegal drugs. Where to find more information  Centers for Disease Control and Prevention: SolutionApps.co.zawww.cdc.gov/std  American Sexual Health Association (ASHA): www.ashastd.org  U.S. Department of Health and Health and safety inspectorHuman Services, Office on Women's Health: ConventionalMedicines.siwww.womenshealth.gov/ or http://www.anderson-williamson.info/https://www.womenshealth.gov/a-z-topics/bacterial-vaginosis Contact a health care provider if:  Your symptoms do not improve, even after treatment.  You have more discharge or pain when urinating.  You have a fever.  You have pain in your abdomen.  You have pain during sex.  You have vaginal bleeding between periods. Summary  Bacterial vaginosis is a vaginal infection that occurs when the normal balance of bacteria in the vagina is disrupted.  Because bacterial vaginosis increases your risk for STIs (sexually transmitted infections), getting treated can help reduce your risk for chlamydia, gonorrhea, herpes, and HIV (human immunodeficiency virus). Treatment is also important for preventing complications in pregnant women, because the condition can cause an early (premature) delivery.  This condition is treated with antibiotic medicines. These may be given as a pill, a vaginal cream, or a medicine that is put into the vagina (suppository). This information is not intended to replace advice given to you by your health care provider. Make sure you discuss any questions you have with your health care provider. Document Released: 02/09/2005 Document Revised: 01/22/2017 Document Reviewed: 10/26/2015 Elsevier Patient Education   2020 [employer]

## 2018-10-28 NOTE — Progress Notes (Signed)
Specimen collected, GC and wet prep. Marilynne Drivers, RN

## 2018-10-29 LAB — GC/CHLAMYDIA PROBE AMP (~~LOC~~) NOT AT ARMC
Chlamydia: NEGATIVE
Neisseria Gonorrhea: NEGATIVE

## 2018-10-29 LAB — CULTURE, OB URINE: Culture: 30000 — AB

## 2019-10-02 ENCOUNTER — Ambulatory Visit (HOSPITAL_COMMUNITY): Payer: Self-pay
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
## 2019-11-11 IMAGING — US TRANSVAGINAL OB ULTRASOUND
1 series · 15 of 28 positions shown · non-contrast
Comparison: None.

CLINICAL DATA: Initial evaluation for acute vaginal bleeding, early
pregnancy.

EXAM:
OBSTETRIC <14 WK US AND TRANSVAGINAL OB US
TECHNIQUE: Both transabdominal and transvaginal ultrasound examinations were
performed for complete evaluation of the gestation as well as the
maternal uterus, adnexal regions, and pelvic cul-de-sac.
Transvaginal technique was performed to assess early pregnancy.

[Series 1: transvaginal ob ultrasound · 49 acquisitions, 15 frames shown]
[im 1/49]
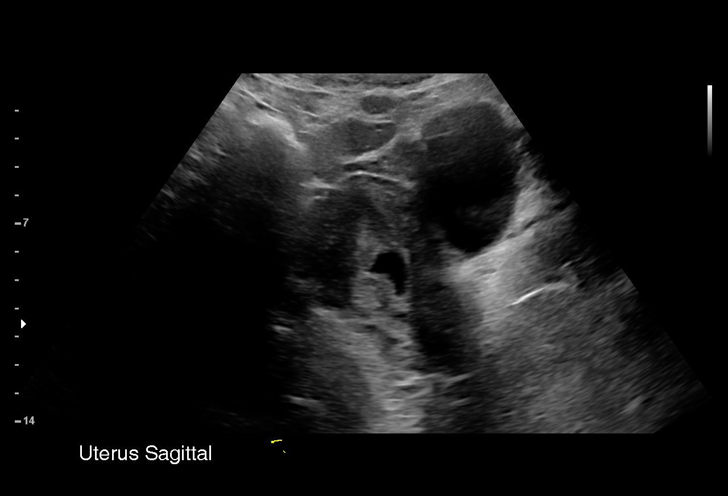
[im 4/49]
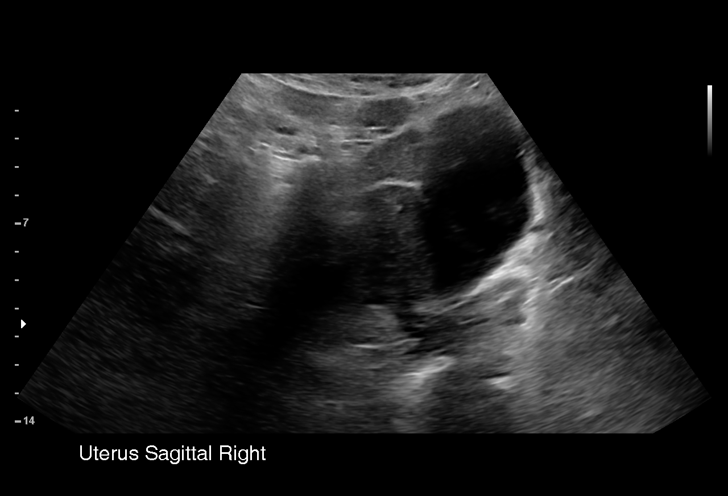
[im 8/49]
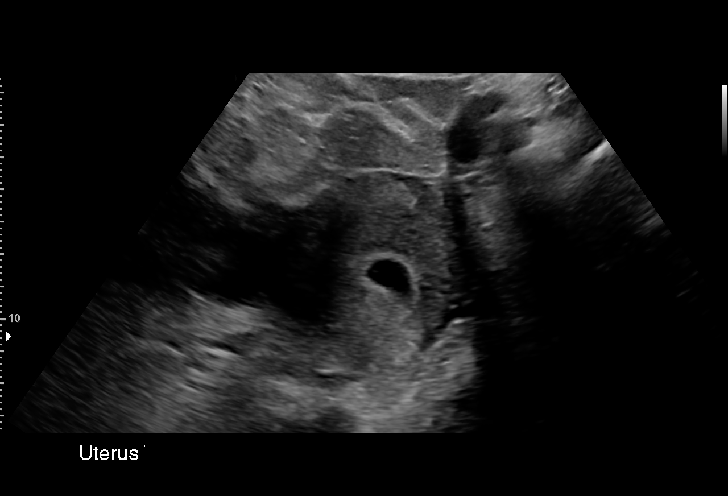
[im 11/49]
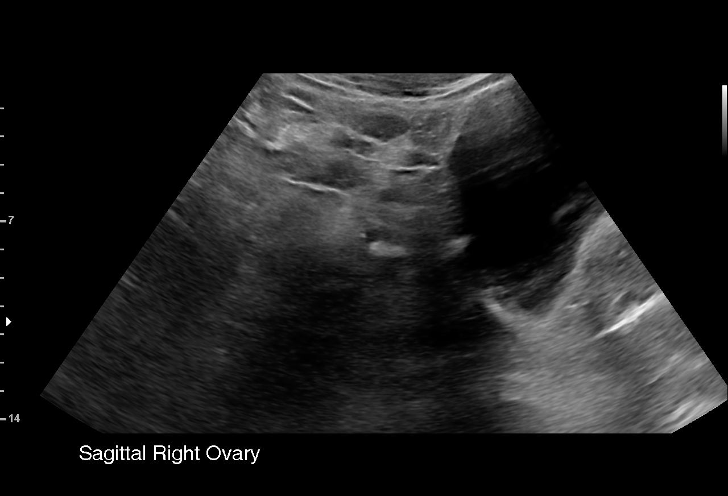
[im 15/49]
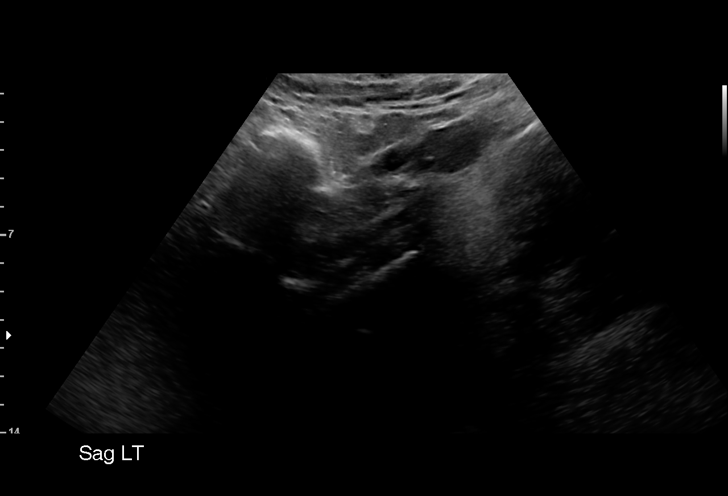
[im 18/49]
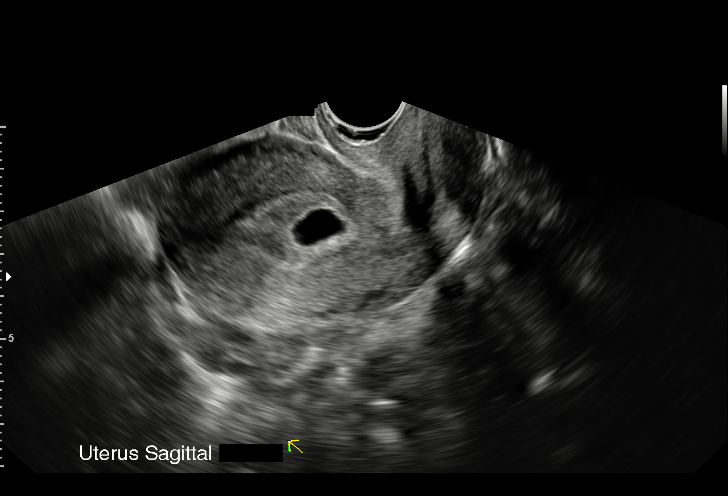
[im 22/49]
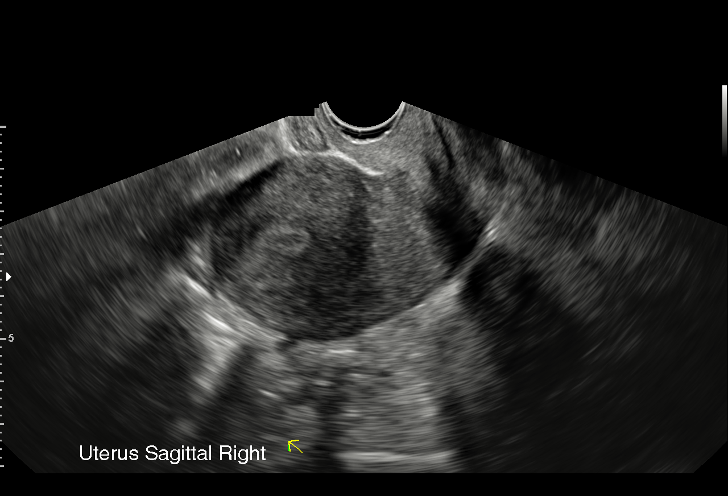
[im 25/49]
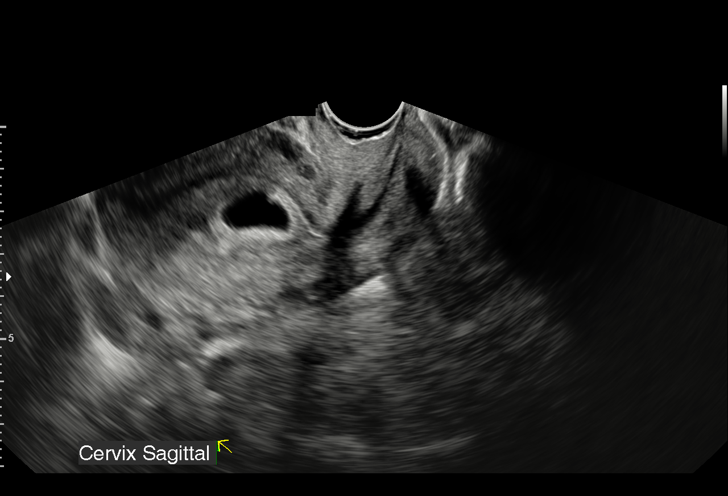
[im 27/49]
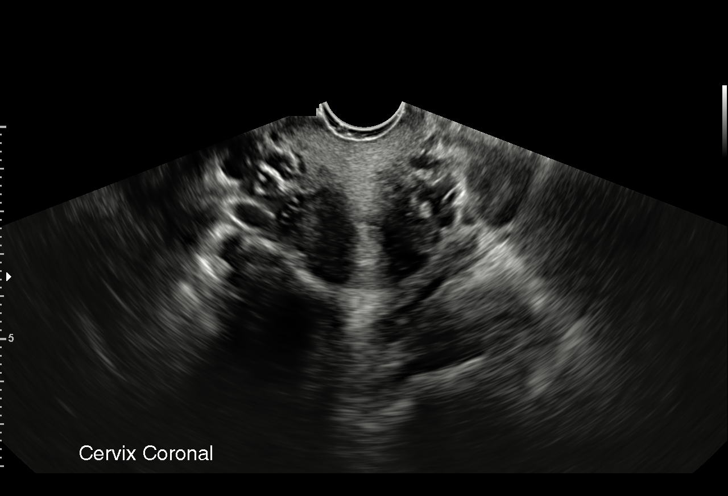
[im 31/49]
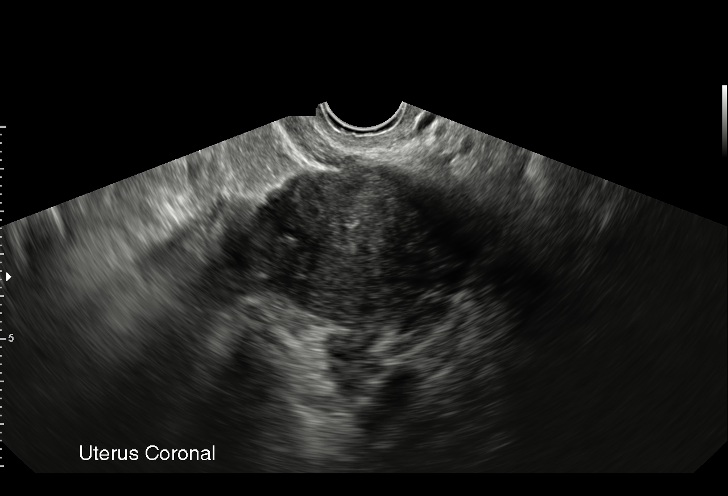
[im 34/49]
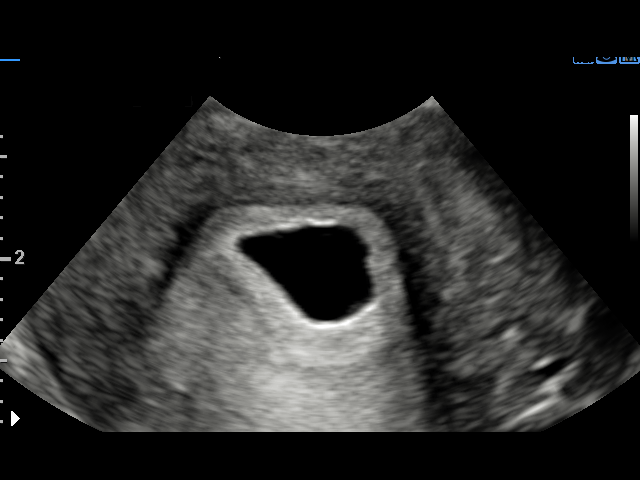
[im 38/49]
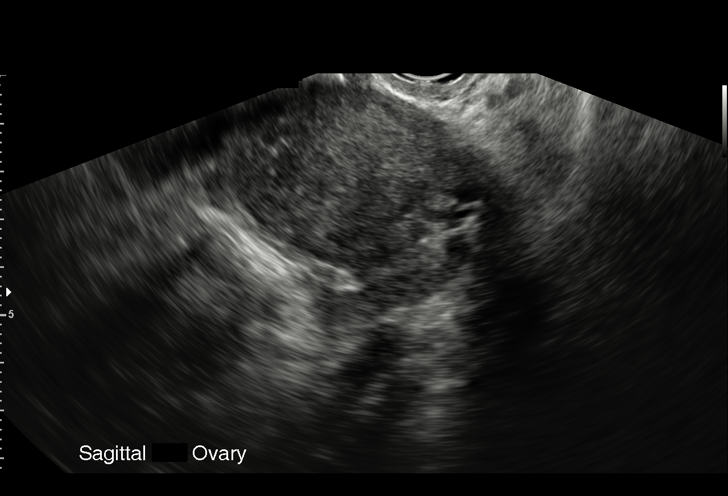
[im 41/49]
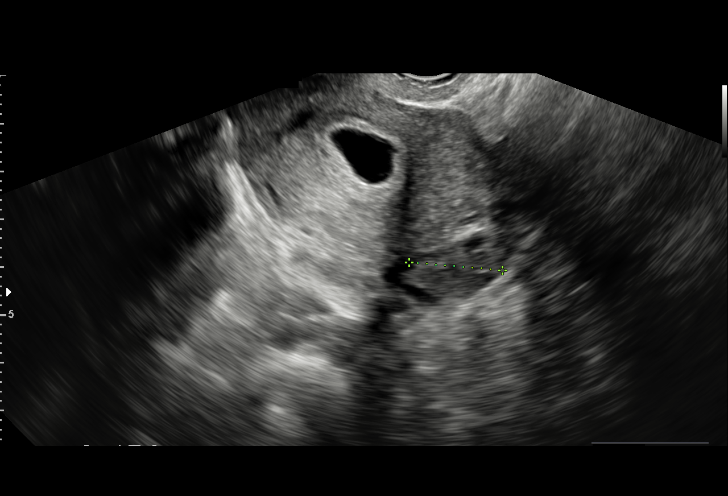
[im 45/49]
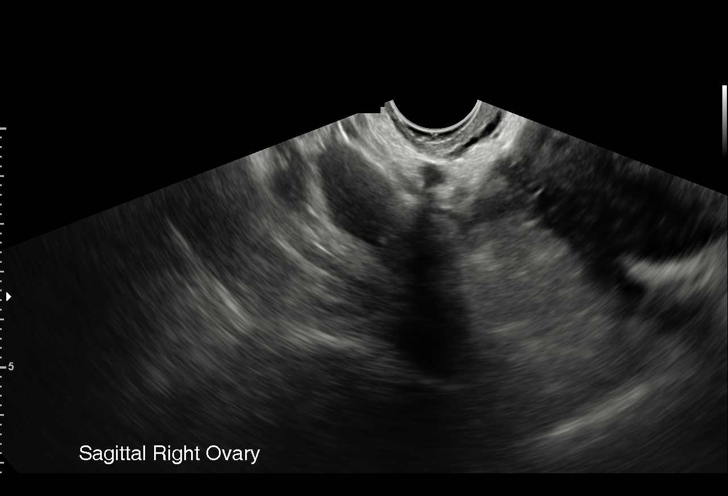
[im 49/49]
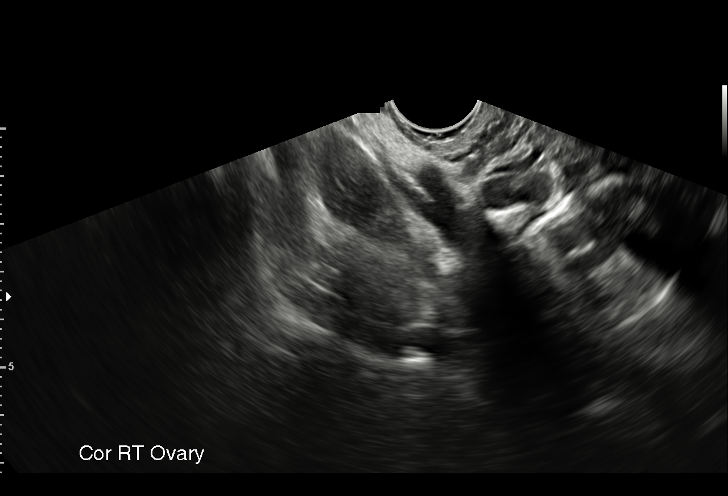

[15 of 28 positions shown; findings below may reference images not displayed]

FINDINGS: Intrauterine gestational sac: Single

Yolk sac:  Not visualized.

Embryo:  Not visualized.

Cardiac Activity: N/A

Heart Rate: N/A  bpm

MSD: 14 mm   6 w   2 d

Subchorionic hemorrhage:  None visualized.

Maternal uterus/adnexae: Ovaries within normal limits. No adnexal
mass. No free fluid within the pelvis.
IMPRESSION: 1. Probable early intrauterine gestational sac, but no yolk sac,
fetal pole, or cardiac activity yet visualized. Recommend follow-up
quantitative B-HCG levels and follow-up US in 14 days to confirm and
assess viability. This recommendation follows SRU consensus
guidelines: Diagnostic Criteria for Nonviable Pregnancy Early in the
First Trimester. N Engl J Med 9978; [DATE].
2. No other acute maternal uterine or adnexal abnormality
identified.

## 2019-12-14 ENCOUNTER — Other Ambulatory Visit: Payer: Self-pay

## 2019-12-14 ENCOUNTER — Inpatient Hospital Stay (HOSPITAL_COMMUNITY)
Admission: AD | Admit: 2019-12-14 | Discharge: 2019-12-15 | Disposition: A | Discharge status: Home / Self Care | Payer: Medicaid Other | Attending: Obstetrics and Gynecology | Admitting: Obstetrics and Gynecology

## 2019-12-14 ENCOUNTER — Encounter (HOSPITAL_COMMUNITY): Payer: Self-pay | Admitting: Obstetrics and Gynecology

## 2019-12-14 DIAGNOSIS — R519 Headache, unspecified: Secondary | ICD-10-CM | POA: Diagnosis not present

## 2019-12-14 DIAGNOSIS — O26892 Other specified pregnancy related conditions, second trimester: Secondary | ICD-10-CM | POA: Insufficient documentation

## 2019-12-14 DIAGNOSIS — J45909 Unspecified asthma, uncomplicated: Secondary | ICD-10-CM | POA: Insufficient documentation

## 2019-12-14 DIAGNOSIS — H43399 Other vitreous opacities, unspecified eye: Secondary | ICD-10-CM | POA: Diagnosis not present

## 2019-12-14 DIAGNOSIS — Z79899 Other long term (current) drug therapy: Secondary | ICD-10-CM | POA: Diagnosis not present

## 2019-12-14 DIAGNOSIS — Z3A17 17 weeks gestation of pregnancy: Secondary | ICD-10-CM | POA: Insufficient documentation

## 2019-12-14 DIAGNOSIS — O99512 Diseases of the respiratory system complicating pregnancy, second trimester: Secondary | ICD-10-CM | POA: Diagnosis not present

## 2019-12-14 HISTORY — DX: Essential (primary) hypertension: I10

## 2019-12-14 NOTE — MAU Note (Signed)
Have a hx of HTN but no meds currently. Stopped taking my meds and my doctors know. I was taking a lot of meds and my b/p was too low.  THink my b/p may be high as I have been seeing floaters. Have not started [facility] yet but plan to go to [facility]. Had u/s at [facility] and told me my due date is 05/22/20. Think my last period was sometime in MAY

## 2019-12-14 NOTE — MAU Provider Note (Signed)
Chief Complaint:  Hypertension and Visual Field Change   First Provider Initiated Contact with Patient 12/14/19 2349     HPI: Hannah Sherman is a 19 y.o. [phone] at 49w1dwho presents to maternity admissions reporting headache.  Worried it is related to hypertension. Is not on any meds for this because they made her BP too low.  Has had some floaters in vision. . She reports good fetal movement, denies LOF, vaginal bleeding, vaginal itching/burning, urinary symptoms, h/a, dizziness, n/v, diarrhea, constipation or fever/chills.  She denies visual changes or RUQ abdominal pain  Hypertension This is a recurrent problem. Associated symptoms include blurred vision (floaters). Pertinent negatives include no anxiety, malaise/fatigue, peripheral edema or shortness of breath. There are no associated agents to hypertension. There are no known risk factors for coronary artery disease. There are no compliance problems.     RN Note: Have a hx of HTN but no meds currently. Stopped taking my meds and my doctors know. I was taking a lot of meds and my b/p was too low.  THink my b/p may be high as I have been seeing floaters. Have not started [facility] yet but plan to go to [facility]. Had u/s at [facility] and told me my due date is 05/22/20. Think my last period was sometime in MAY  Past Medical History: Past Medical History:  Diagnosis Date  . Asthma   . Hypertension     Past obstetric history: OB History  Gravida Para Term Preterm AB Living  4 1 0 1 2 1   SAB TAB Ectopic Multiple Live Births  2 0 0 0 1    # Outcome Date GA Lbr Len/2nd Weight Sex Delivery Anes PTL Lv  4 Current           3 SAB 05/02/19          2 Preterm 02/25/19    F Vag-Spont   LIV  1 SAB 05/2018            Past Surgical History: Past Surgical History:  Procedure Laterality Date  . ABDOMINAL SURGERY    . TONSILLECTOMY AND ADENOIDECTOMY  2003    Family History: Family History  Problem Relation Age of Onset  . Hypertension  Maternal Grandmother   . Healthy Mother   . Healthy Father     Social History: Social History   Tobacco Use  . Smoking status: Never Smoker  . Smokeless tobacco: Never Used  Vaping Use  . Vaping Use: Never used  Substance Use Topics  . Alcohol use: No    Alcohol/week: 0.0 standard drinks  . Drug use: No    Allergies: No Known Allergies  Meds:  Medications Prior to Admission  Medication Sig Dispense Refill Last Dose  . acetaminophen (TYLENOL) 500 MG tablet Take 2 tablets (1,000 mg total) by mouth every 8 (eight) hours as needed for up to 30 doses. 60 tablet 0   . albuterol (PROVENTIL HFA;VENTOLIN HFA) 108 (90 Base) MCG/ACT inhaler Inhale 1 puff into the lungs every 6 (six) hours as needed for wheezing or shortness of breath.   Unknown at Unknown time  . metroNIDAZOLE (FLAGYL) 500 MG tablet Take 1 tablet (500 mg total) by mouth 2 (two) times daily. 14 tablet 0   . nitrofurantoin, macrocrystal-monohydrate, (MACROBID) 100 MG capsule Take 1 capsule (100 mg total) by mouth 2 (two) times daily. 14 capsule 0   . ondansetron (ZOFRAN) 4 MG tablet Take 1 tablet (4 mg total) by mouth every  6 (six) hours. 12 tablet 0   . Prenatal Vit-Fe Fumarate-FA (PRENATAL PO) Take 1 tablet by mouth daily.       I have reviewed patient's Past Medical Hx, Surgical Hx, Family Hx, Social Hx, medications and allergies.   ROS:  Review of Systems  Constitutional: Negative for chills, fever and malaise/fatigue.  Eyes: Positive for blurred vision (floaters).  Respiratory: Negative for shortness of breath.   Genitourinary: Negative for pelvic pain and vaginal bleeding.   Other systems negative  Physical Exam   Patient Vitals for the past 24 hrs:  BP Temp Pulse Resp SpO2 Height Weight  12/14/19 2321 (!) 119/58 -- 83 -- 100 % -- --  12/14/19 2316 -- 97.7 F (36.5 C) -- 18 -- 5\' 4"  (1.626 m) 131.1 kg   Vitals:   12/14/19 2316 12/14/19 2321 12/15/19 0151  BP:  (!) 119/58 (!) 121/58  Pulse:  83 74   Resp: 18  16  Temp: 97.7 F (36.5 C)    SpO2:  100%   Weight: 131.1 kg    Height: 5\' 4"  (1.626 m)      Constitutional: Well-developed, well-nourished female in no acute distress.  Cardiovascular: normal rate and rhythm Respiratory: normal effort, clear to auscultation bilaterally GI: Abd soft, non-tender, gravid appropriate for gestational age.   No rebound or guarding. MS: Extremities nontender, no edema, normal ROM Neurologic: Alert and oriented x 4.  GU: Neg CVAT.  PELVIC EXAM: deferred  FHT:  140   Labs: Results for orders placed or performed during the hospital encounter of 12/14/19 (from the past 24 hour(s))  Urinalysis, Routine w reflex microscopic Urine, Clean Catch     Status: Abnormal   Collection Time: 12/14/19 11:30 PM  Result Value Ref Range   Color, Urine YELLOW YELLOW   APPearance CLEAR CLEAR   Specific Gravity, Urine >1.030 (H) 1.005 - 1.030   pH 6.0 5.0 - 8.0   Glucose, UA NEGATIVE NEGATIVE mg/dL   Hgb urine dipstick NEGATIVE NEGATIVE   Bilirubin Urine NEGATIVE NEGATIVE   Ketones, ur NEGATIVE NEGATIVE mg/dL   Protein, ur NEGATIVE NEGATIVE mg/dL   Nitrite NEGATIVE NEGATIVE   Leukocytes,Ua NEGATIVE NEGATIVE      Imaging:  No results found.  MAU Course/MDM: I have ordered labs and reviewed results. UA is clear BPs are within normal range  Treatments in MAU included Fioricet which resolved headache  Assessment: Single IUP at [redacted]w[redacted]d Headache, likely migraine  Plan: Discharge home Rx Fioricet for headaches Encouraged to start prenatal care, list given Encouraged to return here or to other Urgent Care/ED if she develops worsening of symptoms, increase in pain, fever, or other concerning symptoms.   Pt stable at time of discharge.  12/16/19 CNM, MSN Certified Nurse-Midwife 12/14/2019 11:49 PM

## 2019-12-15 LAB — URINALYSIS, ROUTINE W REFLEX MICROSCOPIC
Bilirubin Urine: NEGATIVE
Glucose, UA: NEGATIVE mg/dL
Hgb urine dipstick: NEGATIVE
Ketones, ur: NEGATIVE mg/dL
Leukocytes,Ua: NEGATIVE
Nitrite: NEGATIVE
Protein, ur: NEGATIVE mg/dL
Specific Gravity, Urine: >1.03 — ABNORMAL HIGH (ref 1.005–1.030)
pH: 6 (ref 5.0–8.0)

## 2019-12-15 MED ORDER — BUTALBITAL-APAP-CAFFEINE 50-325-40 MG PO TABS
1.0000 | ORAL_TABLET | Freq: Once | ORAL | Status: AC
  Administered 2019-12-15: 1 via ORAL
  Filled 2019-12-15: qty 1

## 2019-12-15 MED ORDER — BUTALBITAL-APAP-CAFFEINE 50-325-40 MG PO TABS
1.0000 | ORAL_TABLET | Freq: Four times a day (QID) | ORAL | 0 refills | Status: DC | PRN
Start: 2019-12-15 — End: 2020-03-01

## 2019-12-15 NOTE — Discharge Instructions (Signed)
General Headache Without Cause °A headache is pain or discomfort that is felt around the head or neck area. There are many causes and types of headaches. In some cases, the cause may not be found. °Follow these instructions at home: °Watch your condition for any changes. Let your doctor know about them. Take these steps to help with your condition: °Managing pain ° °  ° °· Take over-the-counter and prescription medicines only as told by your doctor. °· Lie down in a dark, quiet room when you have a headache. °· If told, put ice on your head and neck area: °? Put ice in a plastic bag. °? Place a towel between your skin and the bag. °? Leave the ice on for 20 minutes, 2-3 times per day. °· If told, put heat on the affected area. Use the heat source that your doctor recommends, such as a moist heat pack or a heating pad. °? Place a towel between your skin and the heat source. °? Leave the heat on for 20-30 minutes. °? Remove the heat if your skin turns bright red. This is very important if you are unable to feel pain, heat, or cold. You may have a greater risk of getting burned. °· Keep lights dim if bright lights bother you or make your headaches worse. °Eating and drinking °· Eat meals on a regular schedule. °· If you drink alcohol: °? Limit how much you use to: °§ 0-1 drink a day for women. °§ 0-2 drinks a day for men. °? Be aware of how much alcohol is in your drink. In the [place] one drink equals one 12 oz bottle of beer (355 mL), one 5 oz glass of wine (148 mL), or one 1½ oz glass of hard liquor (44 mL). °· Stop drinking caffeine, or reduce how much caffeine you drink. °General instructions ° °· Keep a journal to find out if certain things bring on headaches. For example, write down: °? What you eat and drink. °? How much sleep you get. °? Any change to your diet or medicines. °· Get a massage or try other ways to relax. °· Limit stress. °· Sit up straight. Do not tighten (tense) your muscles. °· Do not use any  products that contain nicotine or tobacco. This includes cigarettes, e-cigarettes, and chewing tobacco. If you need help quitting, ask your doctor. °· Exercise regularly as told by your doctor. °· Get enough sleep. This often means 7-9 hours of sleep each night. °· Keep all follow-up visits as told by your doctor. This is important. °Contact a doctor if: °· Your symptoms are not helped by medicine. °· You have a headache that feels different than the other headaches. °· You feel sick to your stomach (nauseous) or you throw up (vomit). °· You have a fever. °Get help right away if: °· Your headache gets very bad quickly. °· Your headache gets worse after a lot of physical activity. °· You keep throwing up. °· You have a stiff neck. °· You have trouble seeing. °· You have trouble speaking. °· You have pain in the eye or ear. °· Your muscles are weak or you lose muscle control. °· You lose your balance or have trouble walking. °· You feel like you will pass out (faint) or you pass out. °· You are mixed up (confused). °· You have a seizure. °Summary °· A headache is pain or discomfort that is felt around the head or neck area. °· There are many causes and   types of headaches. In some cases, the cause may not be found.  Keep a journal to help find out what causes your headaches. Watch your condition for any changes. Let your doctor know about them.  Contact a doctor if you have a headache that is different from usual, or if your headache is not helped by medicine.  Get help right away if your headache gets very bad, you throw up, you have trouble seeing, you lose your balance, or you have a seizure. This information is not intended to replace advice given to you by your health care provider. Make sure you discuss any questions you have with your health care provider. Document Revised: 08/30/2017 Document Reviewed: 08/30/2017 Elsevier Patient Education  2020 Elsevier Inc.  [facility]     [facility] [employer] [facility]       Phone: [phone]  [facility] [employer] at [place]   Phone: [phone]  [facility] [employer] at [place]  Phone: [phone]  [facility] at [facility]  Phone: [phone]  [facility] for [facility] at [place]  Phone: [phone]  [facility]   Phone: [phone]  [facility]       Phone: [phone]  [facility]    Phone: [phone]   [facility]    Phone: [phone]  [facility]    Phone: [phone]  [facility]    Phone: [phone]  [facility]       Phone: [phone]   [facility]  Phone: [phone]  [facility]    Phone: [phone]  [facility]   Phone: [phone]  [facility]      Phone: [phone]  [facility]    Phone: [phone]

## 2019-12-15 NOTE — Progress Notes (Signed)
Written and verbal d/c instructions given and understanding voiced. 

## 2019-12-21 ENCOUNTER — Inpatient Hospital Stay (HOSPITAL_COMMUNITY)
Admission: AD | Admit: 2019-12-21 | Discharge: 2019-12-21 | Disposition: A | Discharge status: Home / Self Care | Payer: Medicaid Other | Attending: Obstetrics & Gynecology | Admitting: Obstetrics & Gynecology

## 2019-12-21 ENCOUNTER — Other Ambulatory Visit: Payer: Self-pay

## 2019-12-21 ENCOUNTER — Encounter (HOSPITAL_COMMUNITY): Payer: Self-pay | Admitting: Obstetrics and Gynecology

## 2019-12-21 ENCOUNTER — Ambulatory Visit
Admission: EM | Admit: 2019-12-21 | Discharge: 2019-12-21 | Disposition: A | Discharge status: Home / Self Care | Payer: Medicaid Other | Attending: Emergency Medicine | Admitting: Emergency Medicine

## 2019-12-21 ENCOUNTER — Ambulatory Visit: Payer: Self-pay

## 2019-12-21 DIAGNOSIS — G43809 Other migraine, not intractable, without status migrainosus: Secondary | ICD-10-CM

## 2019-12-21 DIAGNOSIS — G43009 Migraine without aura, not intractable, without status migrainosus: Secondary | ICD-10-CM | POA: Diagnosis not present

## 2019-12-21 DIAGNOSIS — Z79899 Other long term (current) drug therapy: Secondary | ICD-10-CM | POA: Diagnosis not present

## 2019-12-21 DIAGNOSIS — N898 Other specified noninflammatory disorders of vagina: Secondary | ICD-10-CM

## 2019-12-21 DIAGNOSIS — R109 Unspecified abdominal pain: Secondary | ICD-10-CM | POA: Diagnosis not present

## 2019-12-21 DIAGNOSIS — Z7251 High risk heterosexual behavior: Secondary | ICD-10-CM

## 2019-12-21 DIAGNOSIS — G43909 Migraine, unspecified, not intractable, without status migrainosus: Secondary | ICD-10-CM | POA: Insufficient documentation

## 2019-12-21 DIAGNOSIS — Z3A18 18 weeks gestation of pregnancy: Secondary | ICD-10-CM | POA: Diagnosis not present

## 2019-12-21 DIAGNOSIS — O99891 Other specified diseases and conditions complicating pregnancy: Secondary | ICD-10-CM | POA: Insufficient documentation

## 2019-12-21 DIAGNOSIS — R300 Dysuria: Secondary | ICD-10-CM | POA: Diagnosis not present

## 2019-12-21 DIAGNOSIS — O26892 Other specified pregnancy related conditions, second trimester: Secondary | ICD-10-CM | POA: Insufficient documentation

## 2019-12-21 DIAGNOSIS — O0001 Abdominal pregnancy with intrauterine pregnancy: Secondary | ICD-10-CM | POA: Diagnosis not present

## 2019-12-21 DIAGNOSIS — O99352 Diseases of the nervous system complicating pregnancy, second trimester: Secondary | ICD-10-CM | POA: Insufficient documentation

## 2019-12-21 LAB — URINALYSIS, ROUTINE W REFLEX MICROSCOPIC
Bilirubin Urine: NEGATIVE
Glucose, UA: NEGATIVE mg/dL
Hgb urine dipstick: NEGATIVE
Ketones, ur: 5 mg/dL — AB
Nitrite: NEGATIVE
Protein, ur: NEGATIVE mg/dL
Specific Gravity, Urine: 1.026 (ref 1.005–1.030)
pH: 5 (ref 5.0–8.0)

## 2019-12-21 MED ORDER — METOCLOPRAMIDE HCL 5 MG/ML IJ SOLN
10.0000 mg | Freq: Once | INTRAMUSCULAR | Status: AC
  Administered 2019-12-21: 10 mg via INTRAVENOUS
  Filled 2019-12-21: qty 2

## 2019-12-21 MED ORDER — DIPHENHYDRAMINE HCL 50 MG/ML IJ SOLN
12.5000 mg | Freq: Once | INTRAMUSCULAR | Status: AC
  Administered 2019-12-21: 12.5 mg via INTRAVENOUS
  Filled 2019-12-21: qty 1

## 2019-12-21 MED ORDER — LACTATED RINGERS IV SOLN: Freq: Once | INTRAVENOUS | Status: AC

## 2019-12-21 MED ORDER — CEPHALEXIN 500 MG PO CAPS: 500.0000 mg | ORAL_CAPSULE | Freq: Two times a day (BID) | ORAL | 0 refills | Status: AC

## 2019-12-21 MED ORDER — PHENAZOPYRIDINE HCL 200 MG PO TABS: 200.0000 mg | ORAL_TABLET | Freq: Three times a day (TID) | ORAL | 0 refills | Status: DC | PRN

## 2019-12-21 MED ORDER — DEXAMETHASONE SODIUM PHOSPHATE 10 MG/ML IJ SOLN
10.0000 mg | Freq: Once | INTRAMUSCULAR | Status: AC
  Administered 2019-12-21: 10 mg via INTRAVENOUS
  Filled 2019-12-21: qty 1

## 2019-12-21 MED ORDER — PHENAZOPYRIDINE HCL 100 MG PO TABS
200.0000 mg | ORAL_TABLET | Freq: Once | ORAL | Status: AC
  Administered 2019-12-21: 200 mg via ORAL
  Filled 2019-12-21: qty 2

## 2019-12-21 NOTE — MAU Note (Signed)
I just don't feel good. My urine is orange and burns alittle when I pee. Have pain in abd sometimes on L and sometimes on my R. My baby feels "lower". I have had a h/a for 3 days and caffeine pills are not helping. I went to [facility] Monday for abortion. When they put the clamps on I got scared and my b/p went up so they would not do the procedure. Was sent to hosp to have by b/p monitored 24/7. Denies VB. Having some orange d/c

## 2019-12-21 NOTE — ED Triage Notes (Signed)
Pt states seen at ED yesterday for urine discoloration and headaches. States has bumps to vaginal area today. Requesting STD testing. States is [redacted]wks pregnant.

## 2019-12-21 NOTE — MAU Provider Note (Signed)
Chief Complaint:  Headache, Urinary Tract Infection, and Abdominal Pain   First Provider Initiated Contact with Patient 12/21/19 0243     HPI: Hannah Sherman is a 19 y.o. [id number] at 34w1dwho presents to maternity admissions reporting persistent migraine for 3 days, unresponsive to Fioricet.  Also has some dysuria and dark urine.  . She reports good fetal movement, denies LOF, vaginal bleeding, vaginal itching/burning, urinary symptoms, h/a, dizziness, n/v, diarrhea, constipation or fever/chills.  She denies headache, visual changes or RUQ abdominal pain.  Planning an abortion next week because she is worried these headaches mean she will get preeclampsia again Went 3 times for AB and states it hurt a lot so providers told her she wasn't ready.. States was in hospital for a month with preeclampsia last time.   Headache  This is a recurrent problem. The current episode started in the past 7 days. The problem occurs constantly. The problem has been unchanged. The quality of the pain is described as aching. Pertinent negatives include no back pain, blurred vision, coughing, dizziness, muscle aches or visual change. Treatments tried: Fioricet. The treatment provided no relief.  Urinary Tract Infection  This is a new problem. The current episode started in the past 7 days. The problem occurs every urination. The problem has been unchanged. The quality of the pain is described as burning. There has been no fever. She is sexually active. There is no history of pyelonephritis. Pertinent negatives include no chills, discharge or flank pain. She has tried nothing for the symptoms.    RN Note: I just don't feel good. My urine is orange and burns alittle when I pee. Have pain in abd sometimes on L and sometimes on my R. My baby feels "lower". I have had a h/a for 3 days and caffeine pills are not helping. I went to [facility] Monday for abortion. When they put the clamps on I got scared and my b/p went up so they  would not do the procedure. Was sent to hosp to have by b/p monitored 24/7. Denies VB. Having some orange d/c   Past Medical History: Past Medical History:  Diagnosis Date  . Asthma   . Hypertension     Past obstetric history: OB History  Gravida Para Term Preterm AB Living  4 1 0 1 2 1   SAB TAB Ectopic Multiple Live Births  2 0 0 0 1    # Outcome Date GA Lbr Len/2nd Weight Sex Delivery Anes PTL Lv  4 Current           3 SAB 05/02/19          2 Preterm 02/25/19    F Vag-Spont   LIV  1 SAB 05/2018            Past Surgical History: Past Surgical History:  Procedure Laterality Date  . ABDOMINAL SURGERY    . TONSILLECTOMY AND ADENOIDECTOMY  2003    Family History: Family History  Problem Relation Age of Onset  . Hypertension Maternal Grandmother   . Healthy Mother   . Healthy Father     Social History: Social History   Tobacco Use  . Smoking status: Never Smoker  . Smokeless tobacco: Never Used  Vaping Use  . Vaping Use: Never used  Substance Use Topics  . Alcohol use: No    Alcohol/week: 0.0 standard drinks  . Drug use: No    Allergies: No Known Allergies  Meds:  Medications Prior to Admission  Medication Sig  Dispense Refill Last Dose  . butalbital-acetaminophen-caffeine (FIORICET) 50-325-40 MG tablet Take 1-2 tablets by mouth every 6 (six) hours as needed for headache. 20 tablet 0 12/20/2019 at 0100  . Prenatal Vit-Fe Fumarate-FA (PRENATAL PO) Take 1 tablet by mouth daily.   12/20/2019 at Unknown time  . acetaminophen (TYLENOL) 500 MG tablet Take 2 tablets (1,000 mg total) by mouth every 8 (eight) hours as needed for up to 30 doses. 60 tablet 0   . albuterol (PROVENTIL HFA;VENTOLIN HFA) 108 (90 Base) MCG/ACT inhaler Inhale 1 puff into the lungs every 6 (six) hours as needed for wheezing or shortness of breath.       I have reviewed patient's Past Medical Hx, Surgical Hx, Family Hx, Social Hx, medications and allergies.   ROS:  Review of Systems   Constitutional: Negative for chills.  Eyes: Negative for blurred vision.  Respiratory: Negative for cough.   Genitourinary: Negative for flank pain.  Musculoskeletal: Negative for back pain.  Neurological: Negative for dizziness.   Other systems negative  Physical Exam   Patient Vitals for the past 24 hrs:  BP Temp Pulse Resp SpO2 Height Weight  12/21/19 0157 (!) 124/59 99.5 F (37.5 C) 92 18 100 % -- --  12/21/19 0151 -- -- -- -- -- 5\' 4"  (1.626 m) 130.2 kg   Constitutional: Well-developed, well-nourished female in no acute distress.  Cardiovascular: normal rate and rhythm Respiratory: normal effort, clear to auscultation bilaterally GI: Abd soft, non-tender, gravid appropriate for gestational age.   No rebound or guarding. MS: Extremities nontender, no edema, normal ROM Neurologic: Alert and oriented x 4.  GU: Neg CVAT.  PELVIC EXAM: deferred FHT:   153  Labs: Results for orders placed or performed during the hospital encounter of 12/21/19 (from the past 24 hour(s))  Urinalysis, Routine w reflex microscopic Urine, Clean Catch     Status: Abnormal   Collection Time: 12/21/19  2:05 AM  Result Value Ref Range   Color, Urine AMBER (A) YELLOW   APPearance HAZY (A) CLEAR   Specific Gravity, Urine 1.026 1.005 - 1.030   pH 5.0 5.0 - 8.0   Glucose, UA NEGATIVE NEGATIVE mg/dL   Hgb urine dipstick NEGATIVE NEGATIVE   Bilirubin Urine NEGATIVE NEGATIVE   Ketones, ur 5 (A) NEGATIVE mg/dL   Protein, ur NEGATIVE NEGATIVE mg/dL   Nitrite NEGATIVE NEGATIVE   Leukocytes,Ua SMALL (A) NEGATIVE   RBC / HPF 0-5 0 - 5 RBC/hpf   WBC, UA 11-20 0 - 5 WBC/hpf   Bacteria, UA RARE (A) NONE SEEN   Squamous Epithelial / LPF 6-10 0 - 5   Mucus PRESENT       Imaging:  No results found.  MAU Course/MDM: I have ordered labs and reviewed results. Urine not very suspicious for infection. Sent to culture.  Given Pyridium for dysuria.  .  Treatments in MAU included IV hydration, Reglan,  Benadryl, and Decadron which totally stopped headache pain   Discussed migraines are not related to Preeclampsia.  Preeclampsia usually does not happen until after 24 weeks.  Advised patient to discuss this with her doctor in [facility] before proceeding with abortion, which she says she "is against".  States doesn't really want one but is worried about preeclampsia Discussed headache treatment options.    Assessment: Single IUP at 109w1d Migraine headache, relieved Dysuria  Plan: Discharge home Rx Pyridium (limited Rx) for dysuria Urine to culture Has rx for Fioricet.  May add Ibuprofen sparingly until 26 weeks Labor precautions  and fetal kick counts Follow up in Office for prenatal visits  Encouraged to return here or to other Urgent Care/ED if she develops worsening of symptoms, increase in pain, fever, or other concerning symptoms.   Pt stable at time of discharge.  Wynelle Bourgeois CNM, MSN Certified Nurse-Midwife 12/21/2019 2:43 AM

## 2019-12-21 NOTE — Discharge Instructions (Addendum)
Today you received treatment for UTI. Testing for chlamydia, gonorrhea, trichomonas is pending: please look for these results on the MyChart app/website.  We will notify you if you are positive and outline treatment at that time.  Important to avoid all forms of sexual intercourse (oral, vaginal, anal) with any/all partners for the next 7 days to avoid spreading/reinfecting. Any/all sexual partners should be notified of testing/treatment today.  Return for persistent/worsening symptoms or if you develop fever, abdominal or pelvic pain, discharge, genital pain, blood in your urine, or are re-exposed to an STI. 

## 2019-12-21 NOTE — Progress Notes (Signed)
Hannah Sherman CNM in to discuss d/c plan with pt. Written and verbal d/c instructions given and understanding voiced. 

## 2019-12-21 NOTE — ED Provider Notes (Signed)
[facility]    CSN: [id number] Arrival date & time: 12/21/19  1745      History   Chief Complaint Chief Complaint  Patient presents with  . SEXUALLY TRANSMITTED DISEASE    HPI Hannah Sherman is a 19 y.o. female  Presenting for vaginal irritation.  Patient concerned she may have herpes.  No known exposure.  Denies vaginal discharge or pruritus.  States it is a little bit sore.  Currently [redacted] weeks gestation.  Seen in ER yesterday for dysuria, headaches.  Urine culture currently pending.  Denying abdominal or pelvic pain, vaginal bleeding, back pain or fever.  Past Medical History:  Diagnosis Date  . Asthma   . Hypertension     Patient Active Problem List   Diagnosis Date Noted  . Bleeding in early pregnancy 06/02/2018    Past Surgical History:  Procedure Laterality Date  . ABDOMINAL SURGERY    . TONSILLECTOMY AND ADENOIDECTOMY  2003    OB History    Gravida  4   Para  1   Term  0   Preterm  1   AB  2   Living  1     SAB  2   TAB  0   Ectopic  0   Multiple  0   Live Births  1            Home Medications    Prior to Admission medications   Medication Sig Start Date End Date Taking? Authorizing Provider  albuterol (PROVENTIL HFA;VENTOLIN HFA) 108 (90 Base) MCG/ACT inhaler Inhale 1 puff into the lungs every 6 (six) hours as needed for wheezing or shortness of breath.    [provider]  butalbital-acetaminophen-caffeine (FIORICET) 50-325-40 MG tablet Take 1-2 tablets by mouth every 6 (six) hours as needed for headache. 12/15/19 12/14/20  Aviva Signs, CNM  cephALEXin (KEFLEX) 500 MG capsule Take 1 capsule (500 mg total) by mouth 2 (two) times daily for 3 days. 12/21/19 12/24/19  Hall-Potvin, [place], PA-C  phenazopyridine (PYRIDIUM) 200 MG tablet Take 1 tablet (200 mg total) by mouth every 8 (eight) hours as needed for pain. 12/21/19   Aviva Signs, CNM    Family History Family History  Problem Relation Age  of Onset  . Hypertension Maternal Grandmother   . Healthy Mother   . Healthy Father     Social History Social History   Tobacco Use  . Smoking status: Never Smoker  . Smokeless tobacco: Never Used  Vaping Use  . Vaping Use: Never used  Substance Use Topics  . Alcohol use: No    Alcohol/week: 0.0 standard drinks  . Drug use: No     Allergies   Patient has no known allergies.   Review of Systems Review of Systems  Genitourinary: Positive for dysuria and genital sores. Negative for urgency, vaginal bleeding, vaginal discharge and vaginal pain.  All other systems reviewed and are negative.    Physical Exam Triage Vital Signs ED Triage Vitals  Enc Vitals Group     BP 12/21/19 1810 103/62     Pulse Rate 12/21/19 1810 79     Resp 12/21/19 1810 18     Temp 12/21/19 1810 98.9 F (37.2 C)     Temp Source 12/21/19 1810 Oral     SpO2 12/21/19 1810 98 %     Weight --      Height --      Head Circumference --  Peak Flow --      Pain Score 12/21/19 1811 0     Pain Loc --      Pain Edu? --      Excl. in GC? --    No data found.  Updated Vital Signs BP 103/62 (BP Location: Left Arm)   Pulse 79   Temp 98.9 F (37.2 C) (Oral)   Resp 18   SpO2 98%   Visual Acuity Right Eye Distance:   Left Eye Distance:   Bilateral Distance:    Right Eye Near:   Left Eye Near:    Bilateral Near:     Physical Exam Constitutional:      General: She is not in acute distress. HENT:     Head: Normocephalic and atraumatic.  Eyes:     General: No scleral icterus.    Pupils: Pupils are equal, round, and reactive to light.  Cardiovascular:     Rate and Rhythm: Normal rate.  Pulmonary:     Effort: Pulmonary effort is normal.  Genitourinary:    General: Normal vulva.  Skin:    Coloration: Skin is not jaundiced or pale.  Neurological:     Mental Status: She is alert and oriented to person, place, and time.      UC Treatments / Results  Labs (all labs ordered are  listed, but only abnormal results are displayed) Labs Reviewed  CERVICOVAGINAL ANCILLARY ONLY    EKG   Radiology No results found.  Procedures Procedures (including critical care time)  Medications Ordered in UC Medications - No data to display  Initial Impression / Assessment and Plan / UC Course  I have reviewed the triage vital signs and the nursing notes.  Pertinent labs & imaging results that were available during my care of the patient were reviewed by me and considered in my medical decision making (see chart for details).     Exam unremarkable.  Reviewed supportive care.  Regarding dysuria: Culture pending per chart review.  Unable to obtain sample today.  Will give Keflex x3 days, defer to ER for further management if culture is positive.  Return precautions discussed, pt verbalized understanding and is agreeable to plan. Final Clinical Impressions(s) / UC Diagnoses   Final diagnoses:  Vaginal discharge during pregnancy in second trimester  Unprotected sex  Dysuria     Discharge Instructions     Today you received treatment for UTI. Testing for chlamydia, gonorrhea, trichomonas is pending: please look for these results on the MyChart app/website.  We will notify you if you are positive and outline treatment at that time.  Important to avoid all forms of sexual intercourse (oral, vaginal, anal) with any/all partners for the next 7 days to avoid spreading/reinfecting. Any/all sexual partners should be notified of testing/treatment today.  Return for persistent/worsening symptoms or if you develop fever, abdominal or pelvic pain, discharge, genital pain, blood in your urine, or are re-exposed to an STI.    ED Prescriptions    Medication Sig Dispense Auth. Provider   cephALEXin (KEFLEX) 500 MG capsule Take 1 capsule (500 mg total) by mouth 2 (two) times daily for 3 days. 6 capsule Hall-Potvin, [place], PA-C     PDMP not reviewed this encounter.     Odette Fraction [place], [place] 12/21/19 1933

## 2019-12-21 NOTE — Discharge Instructions (Signed)
Migraine Headache A migraine headache is an intense, throbbing pain on one side or both sides of the head. Migraine headaches may also cause other symptoms, such as nausea, vomiting, and sensitivity to light and noise. A migraine headache can last from 4 hours to 3 days. Talk with your doctor about what things may bring on (trigger) your migraine headaches. What are the causes? The exact cause of this condition is not known. However, a migraine may be caused when nerves in the brain become irritated and release chemicals that cause inflammation of blood vessels. This inflammation causes pain. This condition may be triggered or caused by:  Drinking alcohol.  Smoking.  Taking medicines, such as: ? Medicine used to treat chest pain (nitroglycerin). ? Birth control pills. ? Estrogen. ? Certain blood pressure medicines.  Eating or drinking products that contain nitrates, glutamate, aspartame, or tyramine. Aged cheeses, chocolate, or caffeine may also be triggers.  Doing physical activity. Other things that may trigger a migraine headache include:  Menstruation.  Pregnancy.  Hunger.  Stress.  Lack of sleep or too much sleep.  Weather changes.  Fatigue. What increases the risk? The following factors may make you more likely to experience migraine headaches:  Being a certain age. This condition is more common in people who are 26-74 years old.  Being female.  Having a family history of migraine headaches.  Being Caucasian.  Having a mental health condition, such as depression or anxiety.  Being obese. What are the signs or symptoms? The main symptom of this condition is pulsating or throbbing pain. This pain may:  Happen in any area of the head, such as on one side or both sides.  Interfere with daily activities.  Get worse with physical activity.  Get worse with exposure to bright lights or loud noises. Other symptoms may  include:  Nausea.  Vomiting.  Dizziness.  General sensitivity to bright lights, loud noises, or smells. Before you get a migraine headache, you may get warning signs (an aura). An aura may include:  Seeing flashing lights or having blind spots.  Seeing bright spots, halos, or zigzag lines.  Having tunnel vision or blurred vision.  Having numbness or a tingling feeling.  Having trouble talking.  Having muscle weakness. Some people have symptoms after a migraine headache (postdromal phase), such as:  Feeling tired.  Difficulty concentrating. How is this diagnosed? A migraine headache can be diagnosed based on:  Your symptoms.  A physical exam.  Tests, such as: ? CT scan or an MRI of the head. These imaging tests can help rule out other causes of headaches. ? Taking fluid from the spine (lumbar puncture) and analyzing it (cerebrospinal fluid analysis, or CSF analysis). How is this treated? This condition may be treated with medicines that:  Relieve pain.  Relieve nausea.  Prevent migraine headaches. Treatment for this condition may also include:  Acupuncture.  Lifestyle changes like avoiding foods that trigger migraine headaches.  Biofeedback.  Cognitive behavioral therapy. Follow these instructions at home: Medicines  Take over-the-counter and prescription medicines only as told by your health care provider.  Ask your health care provider if the medicine prescribed to you: ? Requires you to avoid driving or using heavy machinery. ? Can cause constipation. You may need to take these actions to prevent or treat constipation:  Drink enough fluid to keep your urine pale yellow.  Take over-the-counter or prescription medicines.  Eat foods that are high in fiber, such as beans, whole grains, and  fresh fruits and vegetables.  Limit foods that are high in fat and processed sugars, such as fried or sweet foods. Lifestyle  Do not drink alcohol.  Do not  use any products that contain nicotine or tobacco, such as cigarettes, e-cigarettes, and chewing tobacco. If you need help quitting, ask your health care provider.  Get at least 8 hours of sleep every night.  Find ways to manage stress, such as meditation, deep breathing, or yoga. General instructions      Keep a journal to find out what may trigger your migraine headaches. For example, write down: ? What you eat and drink. ? How much sleep you get. ? Any change to your diet or medicines.  If you have a migraine headache: ? Avoid things that make your symptoms worse, such as bright lights. ? It may help to lie down in a dark, quiet room. ? Do not drive or use heavy machinery. ? Ask your health care provider what activities are safe for you while you are experiencing symptoms.  Keep all follow-up visits as told by your health care provider. This is important. Contact a health care provider if:  You develop symptoms that are different or more severe than your usual migraine headache symptoms.  You have more than 15 headache days in one month. Get help right away if:  Your migraine headache becomes severe.  Your migraine headache lasts longer than 72 hours.  You have a fever.  You have a stiff neck.  You have vision loss.  Your muscles feel weak or like you cannot control them.  You start to lose your balance often.  You have trouble walking.  You faint.  You have a seizure. Summary  A migraine headache is an intense, throbbing pain on one side or both sides of the head. Migraines may also cause other symptoms, such as nausea, vomiting, and sensitivity to light and noise.  This condition may be treated with medicines and lifestyle changes. You may also need to avoid certain things that trigger a migraine headache.  Keep a journal to find out what may trigger your migraine headaches.  Contact your health care provider if you have more than 15 headache days in a  month or you develop symptoms that are different or more severe than your usual migraine headache symptoms. This information is not intended to replace advice given to you by your health care provider. Make sure you discuss any questions you have with your health care provider. Document Revised: 06/03/2018 Document Reviewed: 03/24/2018 Elsevier Patient Education  2020 [employer]  Dysuria Dysuria is pain or discomfort while urinating. The pain or discomfort may be felt in the part of your body that drains urine from the bladder (urethra) or in the surrounding tissue of the genitals. The pain may also be felt in the groin area, lower abdomen, or lower back. You may have to urinate frequently or have the sudden feeling that you have to urinate (urgency). Dysuria can affect both men and women, but it is more common in women. Dysuria can be caused by many different things, including:  Urinary tract infection.  Kidney stones or bladder stones.  Certain sexually transmitted infections (STIs), such as chlamydia.  Dehydration.  Inflammation of the tissues of the vagina.  Use of certain medicines.  Use of certain soaps or scented products that cause irritation. Follow these instructions at home: General instructions  Watch your condition for any changes.  Urinate often. Avoid holding urine for  long periods of time.  After a bowel movement or urination, women should cleanse from front to back, using each tissue only once.  Urinate after sexual intercourse.  Keep all follow-up visits as told by your health care provider. This is important.  If you had any tests done to find the cause of dysuria, it is up to you to get your test results. Ask your health care provider, or the department that is doing the test, when your results will be ready. Eating and drinking   Drink enough fluid to keep your urine pale yellow.  Avoid caffeine, tea, and alcohol. They can irritate the bladder and  make dysuria worse. In men, alcohol may irritate the prostate. Medicines  Take over-the-counter and prescription medicines only as told by your health care provider.  If you were prescribed an antibiotic medicine, take it as told by your health care provider. Do not stop taking the antibiotic even if you start to feel better. Contact a health care provider if:  You have a fever.  You develop pain in your back or sides.  You have nausea or vomiting.  You have blood in your urine.  You are not urinating as often as you usually do. Get help right away if:  Your pain is severe and not relieved with medicines.  You cannot eat or drink without vomiting.  You are confused.  You have a rapid heartbeat while at rest.  You have shaking or chills.  You feel extremely weak. Summary  Dysuria is pain or discomfort while urinating. Many different conditions can lead to dysuria.  If you have dysuria, you may have to urinate frequently or have the sudden feeling that you have to urinate (urgency).  Watch your condition for any changes. Keep all follow-up visits as told by your health care provider.  Make sure that you urinate often and drink enough fluid to keep your urine pale yellow. This information is not intended to replace advice given to you by your health care provider. Make sure you discuss any questions you have with your health care provider. Document Revised: 01/22/2017 Document Reviewed: 11/26/2016 Elsevier Patient Education  2020 [employer].

## 2019-12-22 LAB — CULTURE, OB URINE

## 2019-12-25 LAB — CERVICOVAGINAL ANCILLARY ONLY
Bacterial Vaginitis (gardnerella): POSITIVE — AB
Candida Glabrata: NEGATIVE
Candida Vaginitis: NEGATIVE
Chlamydia: NEGATIVE
Comment: NEGATIVE
Comment: NEGATIVE
Comment: NEGATIVE
Comment: NEGATIVE
Comment: NEGATIVE
Comment: NORMAL
Neisseria Gonorrhea: NEGATIVE
Trichomonas: NEGATIVE

## 2020-03-01 ENCOUNTER — Inpatient Hospital Stay (HOSPITAL_COMMUNITY)
Admission: AD | Admit: 2020-03-01 | Discharge: 2020-03-01 | Disposition: A | Discharge status: Home / Self Care | Payer: Medicaid Other | Attending: Obstetrics & Gynecology | Admitting: Obstetrics & Gynecology

## 2020-03-01 ENCOUNTER — Other Ambulatory Visit: Payer: Self-pay

## 2020-03-01 DIAGNOSIS — B373 Candidiasis of vulva and vagina: Secondary | ICD-10-CM

## 2020-03-01 DIAGNOSIS — N898 Other specified noninflammatory disorders of vagina: Secondary | ICD-10-CM | POA: Insufficient documentation

## 2020-03-01 DIAGNOSIS — Z3202 Encounter for pregnancy test, result negative: Secondary | ICD-10-CM

## 2020-03-01 DIAGNOSIS — N939 Abnormal uterine and vaginal bleeding, unspecified: Secondary | ICD-10-CM | POA: Diagnosis present

## 2020-03-01 DIAGNOSIS — H9202 Otalgia, left ear: Secondary | ICD-10-CM

## 2020-03-01 DIAGNOSIS — B3731 Acute candidiasis of vulva and vagina: Secondary | ICD-10-CM

## 2020-03-01 LAB — WET PREP, GENITAL
Clue Cells Wet Prep HPF POC: NONE SEEN
Sperm: NONE SEEN
Trich, Wet Prep: NONE SEEN

## 2020-03-01 LAB — URINALYSIS, ROUTINE W REFLEX MICROSCOPIC
Bilirubin Urine: NEGATIVE
Glucose, UA: NEGATIVE mg/dL
Hgb urine dipstick: NEGATIVE
Ketones, ur: NEGATIVE mg/dL
Nitrite: NEGATIVE
Protein, ur: NEGATIVE mg/dL
Specific Gravity, Urine: 1.027 (ref 1.005–1.030)
pH: 6 (ref 5.0–8.0)

## 2020-03-01 LAB — PREGNANCY, URINE: Preg Test, Ur: NEGATIVE

## 2020-03-01 MED ORDER — FLUCONAZOLE 150 MG PO TABS: 150.0000 mg | ORAL_TABLET | Freq: Every day | ORAL | 1 refills | Status: DC

## 2020-03-01 MED ORDER — LEVONORGESTREL 1.5 MG PO TABS: 1.5000 mg | ORAL_TABLET | Freq: Once | ORAL | 0 refills | Status: AC

## 2020-03-01 NOTE — Discharge Instructions (Signed)
Safe Medications in Pregnancy   Acne: Benzoyl Peroxide Salicylic Acid  Backache/Headache: Tylenol: 2 regular strength every 4 hours OR              2 Extra strength every 6 hours  Colds/Coughs/Allergies: Benadryl (alcohol free) 25 mg every 6 hours as needed Breath right strips Claritin Cepacol throat lozenges Chloraseptic throat spray Cold-Eeze- up to three times per day Cough drops, alcohol free Flonase (by prescription only) Guaifenesin Mucinex Robitussin DM (plain only, alcohol free) Saline nasal spray/drops Sudafed (pseudoephedrine) & Actifed ** use only after [redacted] weeks gestation and if you do not have high blood pressure Tylenol Vicks Vaporub Zinc lozenges Zyrtec   Constipation: Colace Ducolax suppositories Fleet enema Glycerin suppositories Metamucil Milk of magnesia Miralax Senokot Smooth move tea  Diarrhea: Kaopectate Imodium A-D  *NO pepto Bismol  Hemorrhoids: Anusol Anusol HC Preparation H Tucks  Indigestion: Tums Maalox Mylanta Zantac  Pepcid  Insomnia: Benadryl (alcohol free) 25mg  every 6 hours as needed Tylenol PM Unisom, no Gelcaps  Leg Cramps: Tums MagGel  Nausea/Vomiting:  Bonine Dramamine Emetrol Ginger extract Sea bands Meclizine  Nausea medication to take during pregnancy:  Unisom (doxylamine succinate 25 mg tablets) Take one tablet daily at bedtime. If symptoms are not adequately controlled, the dose can be increased to a maximum recommended dose of two tablets daily (1/2 tablet in the morning, 1/2 tablet mid-afternoon and one at bedtime). Vitamin B6 100mg  tablets. Take one tablet twice a day (up to 200 mg per day).  Skin Rashes: Aveeno products Benadryl cream or 25mg  every 6 hours as needed Calamine Lotion 1% cortisone cream  Yeast infection: Gyne-lotrimin 7 Monistat 7  Gum/tooth pain: Anbesol  **If taking multiple medications, please check labels to avoid duplicating the same active ingredients **take  medication as directed on the label ** Do not exceed 4000 mg of tylenol in 24 hours **Do not take medications that contain aspirin or ibuprofen    Intrauterine Device Information An intrauterine device (IUD) is a medical device that is inserted in the uterus to prevent pregnancy. It is a small, T-shaped device that has one or two nylon strings hanging down from it. The strings hang out of the lower part of the uterus (cervix) to allow for future IUD removal. There are two types of IUDs available:  Hormone IUD. This type of IUD is made of plastic and contains the hormone progestin (synthetic progesterone). A hormone IUD may last 3-5 years.  Copper IUD. This type of IUD has copper wire wrapped around it. A copper IUD may last up to 10 years. How is an IUD inserted? An IUD is inserted through the vagina and placed into the uterus with a minor medical procedure. The exact procedure for IUD insertion may vary among health care providers and hospitals. How does an IUD work? Synthetic progesterone in a hormonal IUD prevents pregnancy by:  Thickening cervical mucus to prevent sperm from entering the uterus.  Thinning the uterine lining to prevent a fertilized egg from being implanted there. Copper in a copper IUD prevents pregnancy by making the uterus and fallopian tubes produce a fluid that kills sperm. What are the advantages of an IUD? Advantages of either type of IUD  It is highly effective in preventing pregnancy.  It is reversible. You can become pregnant shortly after the IUD is removed.  It is low-maintenance and can stay in place for a long time.  There are no estrogen-related side effects.  It can be used when breastfeeding.  It is not associated with weight gain.  It can be inserted right after childbirth, an abortion, or a miscarriage. Advantages of a hormone IUD  If it is inserted within 7 days of your period starting, it works right after it is inserted. If the hormone  IUD is inserted at any other time in your cycle, you will need to use a backup method of birth control for 7 days after insertion.  It can make menstrual periods lighter.  It can reduce menstrual cramping.  It can be used for 3-5 years. Advantages of a copper IUD  It works right after it is inserted.  It can be used as a form of emergency birth control if it is inserted within 5 days after having unprotected sex.  It does not interfere with your body's natural hormones.  It can be used for 10 years. What are the disadvantages of an IUD?  An IUD may cause irregular menstrual bleeding for a period of time after insertion.  You may have pain during insertion and have cramping and vaginal bleeding after insertion.  An IUD may cut the uterus (uterine perforation) when it is inserted. This is rare.  An IUD may cause pelvic inflammatory disease (PID), which is an infection in the uterus and fallopian tubes. This is rare, and it usually happens during the first 20 days after the IUD is inserted.  A copper IUD can make your menstrual flow heavier and more painful. How is an IUD removed?  You will lie on your back with your knees bent and your feet in footrests (stirrups).  A device will be inserted into your vagina to spread apart the vaginal walls (speculum). This will allow your health care provider to see the strings attached to the IUD.  Your health care provider will use a small instrument (forceps) to grasp the IUD strings and pull firmly until the IUD is removed. You may have some discomfort when the IUD is removed. Your health care provider may recommend taking over-the-counter pain relievers, such as ibuprofen, before the procedure. You may also have minor spotting for a few days after the procedure. The exact procedure for IUD removal may vary among health care providers and hospitals. Is the IUD right for me? Your health care provider will make sure you are a good candidate for  an IUD and will discuss the advantages, disadvantages, and possible side effects with you. Summary  An intrauterine device (IUD) is a medical device that is inserted in the uterus to prevent pregnancy. It is a small, T-shaped device that has one or two nylon strings hanging down from it.  A hormone IUD contains the hormone progestin (synthetic progesterone). A copper IUD has copper wire wrapped around it.  Synthetic progesterone in a hormone IUD prevents pregnancy by thickening cervical mucus and thinning the walls of the uterus. Copper in a copper IUD prevents pregnancy by making the uterus and fallopian tubes produce a fluid that kills sperm.  A hormone IUD can be left in place for 3-5 years. A copper IUD can be left in place for up to 10 years.  An IUD is inserted and removed by a health care provider. You may feel some pain during insertion and removal. Your health care provider may recommend taking over-the-counter pain medicine, such as ibuprofen, before an IUD procedure. This information is not intended to replace advice given to you by your health care provider. Make sure you discuss any questions you have with your health  care provider. Document Revised: 01/22/2017 Document Reviewed: 03/10/2016 Elsevier Patient Education  2020 [employer].

## 2020-03-01 NOTE — MAU Note (Addendum)
PT SAYS SHE HAD A MEDICAL ABORTION ON 01-02-20- AT 19 WEEKS -  AT [facility].   FEELS CRAMPS IN VAGINA - NO CONSTANT  AND ITCHING - STARTED  SAT .   SEES BLOOD WHEN SHE SCRATCHES - ON TISSUE   DID COVID TEST ON 1-5- NEG , BUT HER  LEFT EAR HURTS  AND FEELS CONGESTED.   TOOK LEMON AND TUMERIC AND GINGER / HONEY.  NO DR .

## 2020-03-01 NOTE — MAU Provider Note (Signed)
None     S [name] is a 20 y.o. [phone] patient who presents to MAU today with complaint of vaginal bleeding and itching.   Patient states her symptoms started Saturday and has been consistent.  She reports sexual activity on 02/26/2020 with no pain or discomfort. Patient denies issues with urination or vaginal discharge of concern.  She reports she is unsure of her LMP.  Patient also reports left ear pain and congestion.  She questions if it is related to sinuses.  She states she has not taken anything for her symptoms.   O BP 131/78 (BP Location: Right Arm)   Pulse 84   Temp 98.9 F (37.2 C) (Oral)   Ht 5\' 5"  (1.651 m)   Wt 124.9 kg   BMI 45.83 kg/m  Physical Exam Constitutional:      Appearance: Normal appearance.  HENT:     Head: Normocephalic and atraumatic.  Eyes:     Conjunctiva/sclera: Conjunctivae normal.  Pulmonary:     Effort: Pulmonary effort is normal. No respiratory distress.  Musculoskeletal:        General: Normal range of motion.     Cervical back: Normal range of motion.  Neurological:     Mental Status: She is alert and oriented to person, place, and time.  Psychiatric:        Mood and Affect: Mood normal.        Behavior: Behavior normal.        Thought Content: Thought content normal.    Results for orders placed or performed during the hospital encounter of 03/01/20 (from the past 24 hour(s))  Wet prep, genital     Status: Abnormal   Collection Time: 03/01/20  9:59 PM  Result Value Ref Range   Yeast Wet Prep HPF POC PRESENT (A) NONE SEEN   Trich, Wet Prep NONE SEEN NONE SEEN   Clue Cells Wet Prep HPF POC NONE SEEN NONE SEEN   WBC, Wet Prep HPF POC MANY (A) NONE SEEN   Sperm NONE SEEN   Urinalysis, Routine w reflex microscopic     Status: Abnormal   Collection Time: 03/01/20 10:15 PM  Result Value Ref Range   Color, Urine YELLOW YELLOW   APPearance HAZY (A) CLEAR   Specific Gravity, Urine 1.027 1.005 - 1.030   pH 6.0 5.0 - 8.0    Glucose, UA NEGATIVE NEGATIVE mg/dL   Hgb urine dipstick NEGATIVE NEGATIVE   Bilirubin Urine NEGATIVE NEGATIVE   Ketones, ur NEGATIVE NEGATIVE mg/dL   Protein, ur NEGATIVE NEGATIVE mg/dL   Nitrite NEGATIVE NEGATIVE   Leukocytes,Ua MODERATE (A) NEGATIVE   RBC / HPF 6-10 0 - 5 RBC/hpf   WBC, UA 6-10 0 - 5 WBC/hpf   Bacteria, UA FEW (A) NONE SEEN   Squamous Epithelial / LPF 11-20 0 - 5   Mucus PRESENT    Budding Yeast PRESENT   Pregnancy, urine     Status: None   Collection Time: 03/01/20 10:58 PM  Result Value Ref Range   Preg Test, Ur NEGATIVE NEGATIVE    A Medical screening exam complete Vaginal Irritation Ear Pain  P -Patient able to self swab and results return positive for yeast. -Provider informs patient of results and treatment.  Discussed treatment with oral diflucan and to repeat in 72 hours if symptoms remain. -Patient informed that UPT today is negative. -Discussed c/o ear pain and informed that since UPT negative she would have to seek care at [facility] or  an UC. -Discussed treating symptoms with OTC medications and agreeable to medication list. -Encouraged to pick up medications and take ATC for 24 hours and if no improvement to seek care at [facility].  -Patient also reports no condom usage during most recent sexual encounter and requests Plan B. -Rx for Plan B and Diflucan sent to pharmacy on file.   -Discussed getting LARC method.  Patient expresses desire.  Given information for [facility] and [facility] office for scheduling of annual and IUD placement. -Given information for IUD. -Informed that if symptoms worsen or do not improve patient would have to report to [facility] or UC. -Discharged to home in stable condition.   Gerrit Heck, CNM 03/01/2020 10:50 PM

## 2020-03-03 ENCOUNTER — Emergency Department (HOSPITAL_COMMUNITY)
Admission: EM | Admit: 2020-03-03 | Discharge: 2020-03-04 | Disposition: A | Discharge status: Home / Self Care | Payer: Medicaid Other | Attending: Emergency Medicine | Admitting: Emergency Medicine

## 2020-03-03 ENCOUNTER — Other Ambulatory Visit: Payer: Self-pay

## 2020-03-03 ENCOUNTER — Encounter (HOSPITAL_COMMUNITY): Payer: Self-pay | Admitting: *Deleted

## 2020-03-03 ENCOUNTER — Emergency Department (HOSPITAL_COMMUNITY): Payer: Medicaid Other

## 2020-03-03 DIAGNOSIS — U071 COVID-19: Secondary | ICD-10-CM | POA: Diagnosis not present

## 2020-03-03 DIAGNOSIS — I10 Essential (primary) hypertension: Secondary | ICD-10-CM | POA: Diagnosis not present

## 2020-03-03 DIAGNOSIS — J45909 Unspecified asthma, uncomplicated: Secondary | ICD-10-CM | POA: Insufficient documentation

## 2020-03-03 DIAGNOSIS — R059 Cough, unspecified: Secondary | ICD-10-CM | POA: Diagnosis present

## 2020-03-03 LAB — URINE CULTURE

## 2020-03-03 LAB — POC URINE PREG, ED: Preg Test, Ur: NEGATIVE

## 2020-03-03 MED ORDER — PREDNISONE 20 MG PO TABS: 40.0000 mg | ORAL_TABLET | Freq: Every day | ORAL | 0 refills | Status: AC

## 2020-03-03 MED ORDER — ALBUTEROL SULFATE HFA 108 (90 BASE) MCG/ACT IN AERS
1.0000 | INHALATION_SPRAY | Freq: Once | RESPIRATORY_TRACT | Status: AC
  Administered 2020-03-03: 2 via RESPIRATORY_TRACT
  Filled 2020-03-03: qty 6.7

## 2020-03-03 MED ORDER — ALBUTEROL SULFATE HFA 108 (90 BASE) MCG/ACT IN AERS
1.0000 | INHALATION_SPRAY | Freq: Four times a day (QID) | RESPIRATORY_TRACT | 0 refills | Status: DC | PRN
Start: 2020-03-03 — End: 2021-10-13

## 2020-03-03 NOTE — ED Provider Notes (Signed)
[facility] EMERGENCY DEPARTMENT Provider Note   CSN: [id number] Arrival date & time: 03/03/20  2040     History Chief Complaint  Patient presents with  . Covid    Hannah Sherman is a 20 y.o. female with PMH/o Asthma who presents for evaluation of known COVID 19 infection, cough, generalized body aches, fatigue, rhinorrhea, congestion, sob.  Patient reports she started having symptoms on 02/27/2020.  She was tested for COVID and came back positive.  Patient states that she has continued to have symptoms.  She reports she has a history of asthma and she does not have any inhalers at home.  She reports that particularly at night when she tries to sleep, she feels like she is wheezing and has shortness of breath and cough.  She has not taken a medications for her symptoms.  She has not noted any fever.  She states she has been able to eat and drink without difficulty denies any abdominal pain, nausea/vomiting, diarrhea.  She does not smoke.  She states he does not have to be admitted for asthma before previously.  She has not been vaccinated for COVID-19.  The history is provided by the patient.       Past Medical History:  Diagnosis Date  . Asthma   . Hypertension    during pregnancy     Patient Active Problem List   Diagnosis Date Noted  . Bleeding in early pregnancy 06/02/2018    Past Surgical History:  Procedure Laterality Date  . ABDOMINAL SURGERY    . TONSILLECTOMY AND ADENOIDECTOMY  2003     OB History    Gravida  4   Para  1   Term  0   Preterm  1   AB  2   Living  1     SAB  2   IAB  0   Ectopic  0   Multiple  0   Live Births  1           Family History  Problem Relation Age of Onset  . Hypertension Maternal Grandmother   . Healthy Mother   . Healthy Father     Social History   Tobacco Use  . Smoking status: Never Smoker  . Smokeless tobacco: Never Used  Vaping Use  . Vaping Use: Never used  Substance Use Topics  . Alcohol use:  No    Alcohol/week: 0.0 standard drinks  . Drug use: No    Home Medications Prior to Admission medications   Medication Sig Start Date End Date Taking? Authorizing Provider  albuterol (VENTOLIN HFA) 108 (90 Base) MCG/ACT inhaler Inhale 1-2 puffs into the lungs every 6 (six) hours as needed for wheezing or shortness of breath. 03/03/20  Yes Maxwell Caul, PA-C  predniSONE (DELTASONE) 20 MG tablet Take 2 tablets (40 mg total) by mouth daily for 4 days. 03/03/20 03/07/20 Yes Maxwell Caul, PA-C  fluconazole (DIFLUCAN) 150 MG tablet Take 1 tablet (150 mg total) by mouth daily. 03/01/20   Gerrit Heck, CNM    Allergies    Patient has no known allergies.  Review of Systems   Review of Systems  Constitutional: Positive for fatigue. Negative for fever.  HENT: Positive for congestion and rhinorrhea.   Respiratory: Positive for cough and shortness of breath.   Cardiovascular: Negative for chest pain.  Gastrointestinal: Negative for abdominal pain, nausea and vomiting.  Genitourinary: Negative for dysuria and hematuria.  Neurological: Negative for headaches.  All other  systems reviewed and are negative.   Physical Exam Updated Vital Signs BP 131/86   Pulse 64   Temp 98 F (36.7 C) (Oral)   Resp 16   Ht 5\' 5"  (1.651 m)   Wt 123.8 kg   LMP  (LMP Unknown)   SpO2 100%   BMI 45.43 kg/m   Physical Exam Vitals and nursing note reviewed.  Constitutional:      Appearance: Normal appearance. She is well-developed and well-nourished.  HENT:     Sherman: Normocephalic and atraumatic.     Mouth/Throat:     Mouth: Oropharynx is clear and moist and mucous membranes are normal.  Eyes:     General: Lids are normal.     Extraocular Movements: EOM normal.     Conjunctiva/sclera: Conjunctivae normal.     Pupils: Pupils are equal, round, and reactive to light.  Cardiovascular:     Rate and Rhythm: Normal rate and regular rhythm.     Pulses: Normal pulses.     Heart sounds: Normal heart  sounds. No murmur heard. No friction rub. No gallop.   Pulmonary:     Effort: Pulmonary effort is normal.     Breath sounds: Normal breath sounds.     Comments: Lungs clear to auscultation bilaterally.  Symmetric chest rise.  No wheezing, rales, rhonchi. No evidence of respiratory distress. Able to speak in full sentences without any difficulty.  Abdominal:     Palpations: Abdomen is soft. Abdomen is not rigid.     Tenderness: There is no abdominal tenderness. There is no guarding.     Comments: Abdomen is soft, non-distended, non-tender. No rigidity, No guarding. No peritoneal signs.  Musculoskeletal:        General: Normal range of motion.     Cervical back: Full passive range of motion without pain.  Skin:    General: Skin is warm and dry.     Capillary Refill: Capillary refill takes less than 2 seconds.  Neurological:     Mental Status: She is alert and oriented to person, place, and time.  Psychiatric:        Mood and Affect: Mood and affect normal.        Speech: Speech normal.     ED Results / Procedures / Treatments   Labs (all labs ordered are listed, but only abnormal results are displayed) Labs Reviewed  POC URINE PREG, ED    EKG None  Radiology DG Chest Portable 1 View  Result Date: 03/03/2020 CLINICAL DATA:  20 year old female with shortness of breath. Positive COVID-19. EXAM: PORTABLE CHEST 1 VIEW COMPARISON:  None. FINDINGS: The heart size and mediastinal contours are within normal limits. Both lungs are clear. The visualized skeletal structures are unremarkable. IMPRESSION: No active disease. Electronically Signed   By: 36 M.D.   On: 03/03/2020 23:04    Procedures Procedures (including critical care time)  Medications Ordered in ED Medications  albuterol (VENTOLIN HFA) 108 (90 Base) MCG/ACT inhaler 1-2 puff (2 puffs Inhalation Given 03/03/20 2331)    ED Course  I have reviewed the triage vital signs and the nursing notes.  Pertinent labs &  imaging results that were available during my care of the patient were reviewed by me and considered in my medical decision making (see chart for details).    MDM Rules/Calculators/A&P                          20 year old female who  presents for evaluation of cough, nasal congestion, fatigue, shortness of breath.  She reports recently tested positive for COVID.  She has not been vaccinated.  She reports a history of asthma and states she does not have her inhalers.  She feels at night particularly, she gets short of breath, cough.  On initial arrival, she is afebrile, nontoxic-appearing.  Vital signs are stable.  Lungs clear to auscultation.  No evidence of respiratory distress.  She is able to speak in full sentences without any difficulty.  We will plan for chest x-ray.  Chest x-ray negative for any pneumonia here in the ED.  Patient ambulated in the ED maintaining O2 sats of 94%.  She was at 100% on room air at rest.  I discussed results with patient.  At this time, given her history of asthma, we will refer her to the monoclonal antibody infusion clinic.  We will give her albuterol inhaler since she does not have any at home as well as short course of prednisone.  Urine pregnancy is negative. At this time, patient exhibits no emergent life-threatening condition that require further evaluation in ED. Patient had ample opportunity for questions and discussion. All patient's questions were answered with full understanding. Strict return precautions discussed. Patient expresses understanding and agreement to plan.   Hannah Sherman was evaluated in Emergency Department on 03/04/2020 for the symptoms described in the history of present illness. She was evaluated in the context of the global COVID-19 pandemic, which necessitated consideration that the patient might be at risk for infection with the SARS-CoV-2 virus that causes COVID-19. Institutional protocols and algorithms that pertain to the  evaluation of patients at risk for COVID-19 are in a state of rapid change based on information released by regulatory bodies including the CDC and federal and state organizations. These policies and algorithms were followed during the patient's care in the ED.  Portions of this note were generated with Scientist, clinical (histocompatibility and immunogenetics). Dictation errors may occur despite best attempts at proofreading.   Final Clinical Impression(s) / ED Diagnoses Final diagnoses:  COVID-19    Rx / DC Orders ED Discharge Orders         Ordered    predniSONE (DELTASONE) 20 MG tablet  Daily        03/03/20 2357    albuterol (VENTOLIN HFA) 108 (90 Base) MCG/ACT inhaler  Every 6 hours PRN        03/03/20 2357           Maxwell Caul, PA-C 03/04/20 0005    Vanetta Mulders, MD 03/14/20 [id number]

## 2020-03-03 NOTE — ED Notes (Signed)
Pulse ox checked while ambulating, went as low as 94%. Pt is 100% on room air at rest.

## 2020-03-03 NOTE — Discharge Instructions (Signed)
Use albuterol inhaler as directed.  Take prednisone as directed.  Closely monitor symptoms.  Return emergency department for any difficulty breathing, vomiting, chest pain.  He can follow-up with the COVID care clinic.

## 2020-03-03 NOTE — ED Triage Notes (Signed)
Pt was tested at work on 1/4 and 1/6 and found out on 1/8 that tests were positive.  Pt began to have SOB and mid CP yesterday.  denies any fever or N/V/D

## 2020-03-05 ENCOUNTER — Telehealth: Payer: Self-pay

## 2020-03-05 NOTE — Telephone Encounter (Signed)
Pt. On MAB list. 8 days out from symptoms - does not qualify.

## 2020-04-22 ENCOUNTER — Ambulatory Visit
Admission: EM | Admit: 2020-04-22 | Discharge: 2020-04-22 | Disposition: A | Discharge status: Home / Self Care | Payer: Medicaid Other | Attending: Family Medicine | Admitting: Family Medicine

## 2020-04-22 ENCOUNTER — Encounter: Payer: Self-pay | Admitting: Emergency Medicine

## 2020-04-22 DIAGNOSIS — N760 Acute vaginitis: Secondary | ICD-10-CM

## 2020-04-22 DIAGNOSIS — N3000 Acute cystitis without hematuria: Secondary | ICD-10-CM | POA: Insufficient documentation

## 2020-04-22 LAB — POCT URINALYSIS DIP (MANUAL ENTRY)
Bilirubin, UA: NEGATIVE
Blood, UA: NEGATIVE
Glucose, UA: NEGATIVE mg/dL
Ketones, POC UA: NEGATIVE mg/dL
Nitrite, UA: POSITIVE — AB
Protein Ur, POC: NEGATIVE mg/dL
Spec Grav, UA: 1.025 (ref 1.010–1.025)
Urobilinogen, UA: 0.2 E.U./dL
pH, UA: 6.5 (ref 5.0–8.0)

## 2020-04-22 LAB — POCT URINE PREGNANCY: Preg Test, Ur: NEGATIVE

## 2020-04-22 MED ORDER — FLUCONAZOLE 150 MG PO TABS: 150.0000 mg | ORAL_TABLET | Freq: Once | ORAL | 0 refills | Status: AC

## 2020-04-22 NOTE — ED Provider Notes (Signed)
[facility]    CSN: [id number] Arrival date & time: 04/22/20  1931      History   Chief Complaint Chief Complaint  Patient presents with  . Urinary Tract Infection    HPI Hannah Sherman is a 20 y.o. female.   HPI Hannah Sherman is a 20 y.o. female presents for evaluation of urinary frequency, urgency and dysuria > 7 days, without flank pain, fever or chills. Patient endorses vaginal irritation and burning. No recent changes in sexual partners. No history of recurrent UTI.  Past Medical History:  Diagnosis Date  . Asthma   . Hypertension    during pregnancy     Patient Active Problem List   Diagnosis Date Noted  . Bleeding in early pregnancy 06/02/2018    Past Surgical History:  Procedure Laterality Date  . ABDOMINAL SURGERY    . TONSILLECTOMY AND ADENOIDECTOMY  2003    OB History    Gravida  4   Para  1   Term  0   Preterm  1   AB  2   Living  1     SAB  2   IAB  0   Ectopic  0   Multiple  0   Live Births  1            Home Medications    Prior to Admission medications   Medication Sig Start Date End Date Taking? Authorizing Provider  albuterol (VENTOLIN HFA) 108 (90 Base) MCG/ACT inhaler Inhale 1-2 puffs into the lungs every 6 (six) hours as needed for wheezing or shortness of breath. 03/03/20   Maxwell Caul, PA-C  fluconazole (DIFLUCAN) 150 MG tablet Take 1 tablet (150 mg total) by mouth daily. 03/01/20   Gerrit Heck, CNM    Family History Family History  Problem Relation Age of Onset  . Hypertension Maternal Grandmother   . Healthy Mother   . Healthy Father     Social History Social History   Tobacco Use  . Smoking status: Never Smoker  . Smokeless tobacco: Never Used  Vaping Use  . Vaping Use: Never used  Substance Use Topics  . Alcohol use: No    Alcohol/week: 0.0 standard drinks  . Drug use: No     Allergies   Patient has no known allergies.   Review of Systems Review of  Systems Pertinent negatives listed in HPI  Physical Exam Triage Vital Signs ED Triage Vitals  Enc Vitals Group     BP 04/22/20 1939 106/65     Pulse Rate 04/22/20 1939 79     Resp 04/22/20 1939 17     Temp 04/22/20 1939 97.9 F (36.6 C)     Temp Source 04/22/20 1939 Tympanic     SpO2 04/22/20 1939 97 %     Weight --      Height --      Head Circumference --      Peak Flow --      Pain Score 04/22/20 1955 0     Pain Loc --      Pain Edu? --      Excl. in GC? --    No data found.  Updated Vital Signs BP 106/65 (BP Location: Right Arm)   Pulse 79   Temp 97.9 F (36.6 C) (Tympanic)   Resp 17   SpO2 97%   Visual Acuity Right Eye Distance:   Left Eye Distance:   Bilateral Distance:    Right  Eye Near:   Left Eye Near:    Bilateral Near:     Physical Exam General appearance: alert, well developed, cooperative, no distress Head: Normocephalic, without obvious abnormality, atraumatic Respiratory: Respirations even and unlabored, normal respiratory rate Heart: Rate and rhythm normal.  Abdomen: BS +, no distention, no flank pain Extremities: No gross deformities Skin: Skin color, texture, turgor normal. No rashes seen  Psych: Appropriate mood and affect. Vaginal cytology self collected.  UC Treatments / Results  Labs (all labs ordered are listed, but only abnormal results are displayed) Labs Reviewed  POCT URINALYSIS DIP (MANUAL ENTRY) - Abnormal; Notable for the following components:      Result Value   Clarity, UA cloudy (*)    Nitrite, UA Positive (*)    Leukocytes, UA Small (1+) (*)    All other components within normal limits  URINE CULTURE  POCT URINE PREGNANCY  CERVICOVAGINAL ANCILLARY ONLY    EKG   Radiology No results found.  Procedures Procedures (including critical care time)  Medications Ordered in UC Medications - No data to display  Initial Impression / Assessment and Plan / UC Course  I have reviewed the triage vital signs and the  nursing notes.  Pertinent labs & imaging results that were available during my care of the patient were reviewed by me and considered in my medical decision making (see chart for details).      UA abnormal and findings consistent with UTI. Empiric antibiotic treatment initiated.  Encouraged increase intake of water.  Urine culture pending.  Vaginal cytology pending. Covering for vaginitis with Diflucan. ER if symptoms become severe. Follow-up with PCP if symptoms do not completely resolve. Final Clinical Impressions(s) / UC Diagnoses   Final diagnoses:  Acute cystitis without hematuria  Acute vaginitis   Discharge Instructions   None    ED Prescriptions    Medication Sig Dispense Auth. Provider   fluconazole (DIFLUCAN) 150 MG tablet Take 1 tablet (150 mg total) by mouth once for 1 dose. Repeat in 3 days. 2 tablet Bing Neighbors, FNP     PDMP not reviewed this encounter.   Bing Neighbors, FNP 04/23/20 2144

## 2020-04-22 NOTE — ED Triage Notes (Signed)
Burning with urine and vaginal discharge

## 2020-04-24 ENCOUNTER — Telehealth (HOSPITAL_COMMUNITY): Payer: Self-pay | Admitting: Emergency Medicine

## 2020-04-24 LAB — CERVICOVAGINAL ANCILLARY ONLY
Bacterial Vaginitis (gardnerella): POSITIVE — AB
Candida Glabrata: NEGATIVE
Candida Vaginitis: NEGATIVE
Chlamydia: NEGATIVE
Comment: NEGATIVE
Comment: NEGATIVE
Comment: NEGATIVE
Comment: NEGATIVE
Comment: NEGATIVE
Comment: NORMAL
Neisseria Gonorrhea: NEGATIVE
Trichomonas: NEGATIVE

## 2020-04-24 MED ORDER — METRONIDAZOLE 500 MG PO TABS: 500.0000 mg | ORAL_TABLET | Freq: Two times a day (BID) | ORAL | 0 refills | Status: DC

## 2020-04-26 ENCOUNTER — Telehealth (HOSPITAL_COMMUNITY): Payer: Self-pay | Admitting: Emergency Medicine

## 2020-04-26 LAB — URINE CULTURE: Culture: >=100000 — AB

## 2020-04-26 MED ORDER — CEPHALEXIN 500 MG PO CAPS: 500.0000 mg | ORAL_CAPSULE | Freq: Two times a day (BID) | ORAL | 0 refills | Status: AC

## 2020-06-04 ENCOUNTER — Ambulatory Visit: Payer: Self-pay

## 2020-06-04 ENCOUNTER — Ambulatory Visit
Admission: EM | Admit: 2020-06-04 | Discharge: 2020-06-04 | Disposition: A | Discharge status: Home / Self Care | Payer: Medicaid Other | Attending: Emergency Medicine | Admitting: Emergency Medicine

## 2020-06-04 ENCOUNTER — Other Ambulatory Visit: Payer: Self-pay

## 2020-06-04 DIAGNOSIS — R3989 Other symptoms and signs involving the genitourinary system: Secondary | ICD-10-CM | POA: Diagnosis not present

## 2020-06-04 DIAGNOSIS — N939 Abnormal uterine and vaginal bleeding, unspecified: Secondary | ICD-10-CM

## 2020-06-04 DIAGNOSIS — N898 Other specified noninflammatory disorders of vagina: Secondary | ICD-10-CM | POA: Diagnosis not present

## 2020-06-04 LAB — POCT URINALYSIS DIP (MANUAL ENTRY)
Bilirubin, UA: NEGATIVE
Blood, UA: NEGATIVE
Glucose, UA: NEGATIVE mg/dL
Ketones, POC UA: NEGATIVE mg/dL
Leukocytes, UA: NEGATIVE
Nitrite, UA: POSITIVE — AB
Spec Grav, UA: >=1.03 — AB (ref 1.010–1.025)
Urobilinogen, UA: 0.2 E.U./dL
pH, UA: 6 (ref 5.0–8.0)

## 2020-06-04 LAB — POCT URINE PREGNANCY: Preg Test, Ur: NEGATIVE

## 2020-06-04 MED ORDER — CEPHALEXIN 500 MG PO CAPS: 500.0000 mg | ORAL_CAPSULE | Freq: Four times a day (QID) | ORAL | 0 refills | Status: AC

## 2020-06-04 MED ORDER — VALACYCLOVIR HCL 1 G PO TABS: 1000.0000 mg | ORAL_TABLET | Freq: Two times a day (BID) | ORAL | 0 refills | Status: AC

## 2020-06-04 NOTE — Discharge Instructions (Addendum)
Begin keflex for UTI/Skin infection Warm compresses to area/sitz bath Begin valtrex for ulcerated area Urine culture, vaginal swab and HSV swab pending-we will call with results and provide further treatment as needed  Please follow-up with OB/GYN-contact below Return if any symptoms not improving or worsening

## 2020-06-04 NOTE — ED Triage Notes (Signed)
Pt states had a positive pregnancy test 3 wks ago, started having vaginal bleeding with clots 1wk ago. States is still having light bleeding. Pt c/o having bumps to vaginal area. Pt c/o burning on urination.

## 2020-06-05 NOTE — ED Provider Notes (Signed)
[facility]    CSN: [id number] Arrival date & time: 06/04/20  1939      History   Chief Complaint Chief Complaint  Patient presents with  . Vaginal Bleeding  . Urinary Tract Infection  . SEXUALLY TRANSMITTED DISEASE    HPI Hannah Sherman is a 20 y.o. female presenting today for evaluation of possible miscarriage, genital lesion and possible UTI/vaginal discharge. Reports late cycle approximately 3 weeks ago, typically regular. At home pregnancy test positive. Began bleeding 1 week ago, passing clots, mild cramping. Similar to prior miscarraiges. bleeding has eased off. Now with discharge, dysuria and genital sore x 2-3 days.   HPI  Past Medical History:  Diagnosis Date  . Asthma   . Hypertension    during pregnancy     Patient Active Problem List   Diagnosis Date Noted  . Bleeding in early pregnancy 06/02/2018    Past Surgical History:  Procedure Laterality Date  . ABDOMINAL SURGERY    . TONSILLECTOMY AND ADENOIDECTOMY  2003    OB History    Gravida  4   Para  1   Term  0   Preterm  1   AB  2   Living  1     SAB  2   IAB  0   Ectopic  0   Multiple  0   Live Births  1            Home Medications    Prior to Admission medications   Medication Sig Start Date End Date Taking? Authorizing Provider  cephALEXin (KEFLEX) 500 MG capsule Take 1 capsule (500 mg total) by mouth 4 (four) times daily for 5 days. 06/04/20 06/09/20 Yes Yennifer Segovia C, PA-C  valACYclovir (VALTREX) 1000 MG tablet Take 1 tablet (1,000 mg total) by mouth 2 (two) times daily for 7 days. 06/04/20 06/11/20 Yes Tangelia Sanson C, PA-C  albuterol (VENTOLIN HFA) 108 (90 Base) MCG/ACT inhaler Inhale 1-2 puffs into the lungs every 6 (six) hours as needed for wheezing or shortness of breath. 03/03/20   Maxwell Caul, PA-C    Family History Family History  Problem Relation Age of Onset  . Hypertension Maternal Grandmother   . Healthy Mother   . Healthy Father      Social History Social History   Tobacco Use  . Smoking status: Never Smoker  . Smokeless tobacco: Never Used  Vaping Use  . Vaping Use: Never used  Substance Use Topics  . Alcohol use: No    Alcohol/week: 0.0 standard drinks  . Drug use: No     Allergies   Patient has no known allergies.   Review of Systems Review of Systems  Constitutional: Negative for fever.  Respiratory: Negative for shortness of breath.   Cardiovascular: Negative for chest pain.  Gastrointestinal: Negative for abdominal pain, diarrhea, nausea and vomiting.  Genitourinary: Positive for dysuria, genital sores, vaginal bleeding and vaginal discharge. Negative for flank pain, hematuria, menstrual problem and vaginal pain.  Musculoskeletal: Negative for back pain.  Skin: Negative for color change and rash.  Neurological: Negative for dizziness, light-headedness and headaches.     Physical Exam Triage Vital Signs ED Triage Vitals [06/04/20 2002]  Enc Vitals Group     BP (!) 148/85     Pulse Rate 69     Resp 18     Temp 98.4 F (36.9 C)     Temp Source Oral     SpO2 98 %  Weight      Height      Head Circumference      Peak Flow      Pain Score 3     Pain Loc      Pain Edu?      Excl. in GC?    No data found.  Updated Vital Signs BP (!) 148/85 (BP Location: Left Arm)   Pulse 69   Temp 98.4 F (36.9 C) (Oral)   Resp 18   SpO2 98%   Breastfeeding No   Visual Acuity Right Eye Distance:   Left Eye Distance:   Bilateral Distance:    Right Eye Near:   Left Eye Near:    Bilateral Near:     Physical Exam Vitals and nursing note reviewed.  Constitutional:      Appearance: She is well-developed.     Comments: No acute distress  HENT:     Head: Normocephalic and atraumatic.     Nose: Nose normal.  Eyes:     Conjunctiva/sclera: Conjunctivae normal.  Cardiovascular:     Rate and Rhythm: Normal rate.  Pulmonary:     Effort: Pulmonary effort is normal. No respiratory  distress.  Abdominal:     General: There is no distension.  Genitourinary:    Comments: Mons with small area of erythema and fluctuance  Left labia majora with clustered area with ulcerated appearance  Vagina with white discharge Musculoskeletal:        General: Normal range of motion.     Cervical back: Neck supple.  Skin:    General: Skin is warm and dry.  Neurological:     Mental Status: She is alert and oriented to person, place, and time.      UC Treatments / Results  Labs (all labs ordered are listed, but only abnormal results are displayed) Labs Reviewed  POCT URINALYSIS DIP (MANUAL ENTRY) - Abnormal; Notable for the following components:      Result Value   Spec Grav, UA >=1.030 (*)    Protein Ur, POC trace (*)    Nitrite, UA Positive (*)    All other components within normal limits  URINE CULTURE  HSV CULTURE AND TYPING  POCT URINE PREGNANCY  CERVICOVAGINAL ANCILLARY ONLY    EKG   Radiology No results found.  Procedures Procedures (including critical care time)  Medications Ordered in UC Medications - No data to display  Initial Impression / Assessment and Plan / UC Course  I have reviewed the triage vital signs and the nursing notes.  Pertinent labs & imaging results that were available during my care of the patient were reviewed by me and considered in my medical decision making (see chart for details).     Vaginal bleeding/spontaneous miscarriage- pregnancy test negative, suspicious of miscarriage, no longer bleeding. Encourage follow up with OBGYN.  Dysuria- positive nitrites, history of UTI vs burning from ulcerated lesion, culture pending ; initialing on keflex to cover UTI/small abscess/folliculitis to mons  Genital sore- concerning for HSV, swab pending, starting on valtrex  Will alter therapy based off results with more definitive results.   Discussed strict return precautions. Patient verbalized understanding and is agreeable with  plan.    Final Clinical Impressions(s) / UC Diagnoses   Final diagnoses:  Vaginal bleeding  Vaginal discharge  Genital sore     Discharge Instructions     Begin keflex for UTI/Skin infection Warm compresses to area/sitz bath Begin valtrex for ulcerated area Urine culture, vaginal swab and  HSV swab pending-we will call with results and provide further treatment as needed  Please follow-up with OB/GYN-contact below Return if any symptoms not improving or worsening    ED Prescriptions    Medication Sig Dispense Auth. Provider   valACYclovir (VALTREX) 1000 MG tablet Take 1 tablet (1,000 mg total) by mouth 2 (two) times daily for 7 days. 14 tablet Gaile Allmon C, PA-C   cephALEXin (KEFLEX) 500 MG capsule Take 1 capsule (500 mg total) by mouth 4 (four) times daily for 5 days. 20 capsule Viliami Bracco, [place] C, PA-C     PDMP not reviewed this encounter.   Ugochukwu Chichester, [place] C, PA-C 06/05/20 1020

## 2020-06-06 ENCOUNTER — Telehealth (HOSPITAL_COMMUNITY): Payer: Self-pay | Admitting: Emergency Medicine

## 2020-06-06 LAB — CERVICOVAGINAL ANCILLARY ONLY
Bacterial Vaginitis (gardnerella): POSITIVE — AB
Candida Glabrata: NEGATIVE
Candida Vaginitis: POSITIVE — AB
Chlamydia: POSITIVE — AB
Comment: NEGATIVE
Comment: NEGATIVE
Comment: NEGATIVE
Comment: NEGATIVE
Comment: NEGATIVE
Comment: NORMAL
Neisseria Gonorrhea: NEGATIVE
Trichomonas: POSITIVE — AB

## 2020-06-06 MED ORDER — FLUCONAZOLE 150 MG PO TABS: 150.0000 mg | ORAL_TABLET | Freq: Once | ORAL | 0 refills | Status: AC

## 2020-06-06 MED ORDER — METRONIDAZOLE 500 MG PO TABS: 500.0000 mg | ORAL_TABLET | Freq: Two times a day (BID) | ORAL | 0 refills | Status: DC

## 2020-06-06 MED ORDER — DOXYCYCLINE HYCLATE 100 MG PO CAPS: 100.0000 mg | ORAL_CAPSULE | Freq: Two times a day (BID) | ORAL | 0 refills | Status: AC

## 2020-06-07 LAB — URINE CULTURE: Culture: >=100000 — AB

## 2020-06-13 ENCOUNTER — Telehealth (HOSPITAL_COMMUNITY): Payer: Self-pay | Admitting: Emergency Medicine

## 2020-06-13 NOTE — Telephone Encounter (Signed)
Patient left voicemail over night looking for results.  Upon returning her call, patient is looking for HSV results.  Chart review shows lab was cancelled.  Reviewed with staff on site and in order to get a result patient will need to return for blood work.   Patient to return for nurse visit for blood collection, staff on site aware

## 2020-07-02 ENCOUNTER — Ambulatory Visit: Payer: Self-pay

## 2020-07-02 ENCOUNTER — Encounter (HOSPITAL_COMMUNITY): Payer: Self-pay

## 2020-07-02 ENCOUNTER — Ambulatory Visit (HOSPITAL_COMMUNITY)
Admission: EM | Admit: 2020-07-02 | Discharge: 2020-07-02 | Disposition: A | Discharge status: Home / Self Care | Payer: Medicaid Other | Attending: Student | Admitting: Student

## 2020-07-02 ENCOUNTER — Other Ambulatory Visit: Payer: Self-pay

## 2020-07-02 DIAGNOSIS — Z113 Encounter for screening for infections with a predominantly sexual mode of transmission: Secondary | ICD-10-CM | POA: Diagnosis not present

## 2020-07-02 DIAGNOSIS — A749 Chlamydial infection, unspecified: Secondary | ICD-10-CM | POA: Diagnosis not present

## 2020-07-02 DIAGNOSIS — B009 Herpesviral infection, unspecified: Secondary | ICD-10-CM | POA: Diagnosis not present

## 2020-07-02 DIAGNOSIS — A599 Trichomoniasis, unspecified: Secondary | ICD-10-CM | POA: Diagnosis not present

## 2020-07-02 LAB — POCT URINALYSIS DIPSTICK, ED / UC
Bilirubin Urine: NEGATIVE
Glucose, UA: NEGATIVE mg/dL
Ketones, ur: NEGATIVE mg/dL
Leukocytes,Ua: NEGATIVE
Nitrite: NEGATIVE
Protein, ur: NEGATIVE mg/dL
Specific Gravity, Urine: >=1.03 (ref 1.005–1.030)
Urobilinogen, UA: 0.2 mg/dL (ref 0.0–1.0)
pH: 6 (ref 5.0–8.0)

## 2020-07-02 LAB — POC URINE PREG, ED: Preg Test, Ur: NEGATIVE

## 2020-07-02 MED ORDER — METRONIDAZOLE 500 MG PO TABS: 500.0000 mg | ORAL_TABLET | Freq: Two times a day (BID) | ORAL | 0 refills | Status: AC

## 2020-07-02 MED ORDER — DOXYCYCLINE HYCLATE 100 MG PO CAPS: 100.0000 mg | ORAL_CAPSULE | Freq: Two times a day (BID) | ORAL | 0 refills | Status: AC

## 2020-07-02 MED ORDER — VALACYCLOVIR HCL 500 MG PO TABS: 500.0000 mg | ORAL_TABLET | Freq: Two times a day (BID) | ORAL | 0 refills | Status: AC

## 2020-07-02 NOTE — Discharge Instructions (Addendum)
-  For trichomonas and BV, Flagyl (metronidazole), twice daily for 7 days.  Make sure to avoid alcohol while taking these medications because it will cause severe nausea and vomiting. -For chlamydia, doxycycline twice daily for 7 days.   -For suspected genital herpes, Valtrex twice daily for 3 days. -Make sure to complete your course of antibiotics, because untreated trichomonas and chlamydia can cause pelvic inflammatory disease, and this infection can decrease your fertility later in life. -Seek additional immediate medical attention if you develop new symptoms like worsening abdominal pain, back pain, fever/chills despite treatment.

## 2020-07-02 NOTE — ED Notes (Signed)
At bedside for laura graham , pa examination of patient and obtaining specimen for left labia.  Specimen labeled and placed in lab

## 2020-07-02 NOTE — ED Provider Notes (Addendum)
[facility]    CSN: [id number] Arrival date & time: 07/02/20  1939      History   Chief Complaint Chief Complaint  Patient presents with  . Dysuria    HPI Hannah Sherman is a 20 y.o. female presenting with vague crampy lower abdominal pain and vaginal discharge following STI exposure.  This patient tested positive for chlamydia, trichomonas, BV, yeast candidiasis on 06/04/2020, and was prescribed doxycycline, Flagyl, Diflucan.  However, she lost these prescriptions after 4 days and did not complete her course.  States she was tested for HSV given vaginal lesion, but the swab seems to have been lost, and did not result.  Today, she presents with dysuria, vague crampy lower abdominal pain with vaginal discharge once again for 3 days. States she does not know what the discharge looks like. Burning vaginal lesion external L labia. Very concerned she has genital herpes and is requesting this swab as well. Denies hematuria, frequency, urgency, back pain, n/v/d, fevers/chills, dizziness, malaise. Currently menstruating.   HPI  Past Medical History:  Diagnosis Date  . Asthma   . Hypertension    during pregnancy     Patient Active Problem List   Diagnosis Date Noted  . Bleeding in early pregnancy 06/02/2018    Past Surgical History:  Procedure Laterality Date  . ABDOMINAL SURGERY    . TONSILLECTOMY AND ADENOIDECTOMY  2003    OB History    Gravida  4   Para  1   Term  0   Preterm  1   AB  2   Living  1     SAB  2   IAB  0   Ectopic  0   Multiple  0   Live Births  1            Home Medications    Prior to Admission medications   Medication Sig Start Date End Date Taking? Authorizing Provider  doxycycline (VIBRAMYCIN) 100 MG capsule Take 1 capsule (100 mg total) by mouth 2 (two) times daily for 7 days. 07/02/20 07/09/20 Yes Rhys Martini, PA-C  metroNIDAZOLE (FLAGYL) 500 MG tablet Take 1 tablet (500 mg total) by mouth 2 (two) times daily for  7 days. 07/02/20 07/09/20 Yes Rhys Martini, PA-C  valACYclovir (VALTREX) 500 MG tablet Take 1 tablet (500 mg total) by mouth 2 (two) times daily for 3 days. 07/02/20 07/05/20 Yes Rhys Martini, PA-C  albuterol (VENTOLIN HFA) 108 (90 Base) MCG/ACT inhaler Inhale 1-2 puffs into the lungs every 6 (six) hours as needed for wheezing or shortness of breath. 03/03/20   Maxwell Caul, PA-C    Family History Family History  Problem Relation Age of Onset  . Hypertension Maternal Grandmother   . Healthy Mother   . Healthy Father     Social History Social History   Tobacco Use  . Smoking status: Never Smoker  . Smokeless tobacco: Never Used  Vaping Use  . Vaping Use: Never used  Substance Use Topics  . Alcohol use: No    Alcohol/week: 0.0 standard drinks  . Drug use: No     Allergies   Patient has no known allergies.   Review of Systems Review of Systems  Constitutional: Negative for chills and fever.  HENT: Negative for sore throat.   Eyes: Negative for pain and redness.  Respiratory: Negative for shortness of breath.   Cardiovascular: Negative for chest pain.  Gastrointestinal: Positive for abdominal pain. Negative for diarrhea,  nausea and vomiting.  Genitourinary: Positive for dysuria, genital sores and vaginal discharge. Negative for decreased urine volume, difficulty urinating, flank pain, frequency, hematuria, menstrual problem, pelvic pain, urgency, vaginal bleeding and vaginal pain.  Musculoskeletal: Negative for back pain.  Skin: Negative for rash.  All other systems reviewed and are negative.    Physical Exam Triage Vital Signs ED Triage Vitals  Enc Vitals Group     BP 07/02/20 2020 (!) 127/93     Pulse Rate 07/02/20 2020 69     Resp 07/02/20 2020 18     Temp 07/02/20 2020 99.2 F (37.3 C)     Temp Source 07/02/20 2020 Oral     SpO2 07/02/20 2020 100 %     Weight --      Height --      Head Circumference --      Peak Flow --      Pain Score 07/02/20 2018  2     Pain Loc --      Pain Edu? --      Excl. in GC? --    No data found.  Updated Vital Signs BP (!) 127/93 (BP Location: Right Arm)   Pulse 69   Temp 99.2 F (37.3 C) (Oral)   Resp 18   LMP  (Within Weeks) Comment: 1 week  SpO2 100%   Breastfeeding No   Visual Acuity Right Eye Distance:   Left Eye Distance:   Bilateral Distance:    Right Eye Near:   Left Eye Near:    Bilateral Near:     Physical Exam Vitals reviewed. Exam conducted with a chaperone present.  Constitutional:      General: She is not in acute distress.    Appearance: Normal appearance. She is not ill-appearing.  HENT:     Head: Normocephalic and atraumatic.     Mouth/Throat:     Mouth: Mucous membranes are moist.     Comments: Moist mucous membranes Eyes:     Extraocular Movements: Extraocular movements intact.     Pupils: Pupils are equal, round, and reactive to light.  Cardiovascular:     Rate and Rhythm: Normal rate and regular rhythm.     Heart sounds: Normal heart sounds.  Pulmonary:     Effort: Pulmonary effort is normal.     Breath sounds: Normal breath sounds. No wheezing, rhonchi or rales.  Abdominal:     General: Bowel sounds are normal. There is no distension.     Palpations: Abdomen is soft. There is no mass.     Tenderness: There is no abdominal tenderness. There is no right CVA tenderness, left CVA tenderness, guarding or rebound.     Comments: No abd pain  Genitourinary:    Labia:        Right: No rash, tenderness, lesion or injury.        Left: Lesion present. No rash, tenderness or injury.      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  No discharge visible on external genitalia. L labia majora with few vesicular lesions on erythematous base. Mildly TTP. No discharge. Skin:    General: Skin is warm.     Capillary Refill: Capillary refill takes less than 2 seconds.     Comments: Good skin turgor  Neurological:     General: No focal deficit  present.     Mental Status: She is alert and oriented to person, place, and time.  Psychiatric:  Mood and Affect: Mood normal.        Behavior: Behavior normal.      UC Treatments / Results  Labs (all labs ordered are listed, but only abnormal results are displayed) Labs Reviewed  POCT URINALYSIS DIPSTICK, ED / UC - Abnormal; Notable for the following components:      Result Value   Hgb urine dipstick TRACE (*)    All other components within normal limits  HSV CULTURE AND TYPING  POC URINE PREG, ED  CERVICOVAGINAL ANCILLARY ONLY    EKG   Radiology No results found.  Procedures Procedures (including critical care time)  Medications Ordered in UC Medications - No data to display  Initial Impression / Assessment and Plan / UC Course  I have reviewed the triage vital signs and the nursing notes.  Pertinent labs & imaging results that were available during my care of the patient were reviewed by me and considered in my medical decision making (see chart for details).     This patient is a 20 year old female presenting with recurrent vaginal discharge and abdominal pain following testing positive for chlamydia, gonorrhea, BV, yeast on 06/13/2020.  She was prescribed appropriate antibiotic regimen, but lost the prescriptions after 4 days.  Presenting today with recurrent symptoms for about 3 days.  Patient declines internal pelvic exam multiple times.  She reports abdominal pain, but no abd pain or back pain on exam, she is afebrile and nontachycardic. Low suspicion for PID.   UA with trace blood, otherwise wnl. She is on her period. Negative urine pregnancy. Self swab sent for G/C, trich, BV, yeast. Declines HIV, RPR HSV swab collected by myself.  Again sent prescriptions for doxycycline, Flagyl, Valtrex.  Discussed risks and benefits of treating HSV before swab results, but patient is very desirous of starting this now.  ED return precautions discussed.  Coding  this visit a Level 4 for review of past notes (05/2020 STI visit), past tests (05/2020), ordering of new tests today (HSV, STI, UA, pregnancy), prescription drug management.    Final Clinical Impressions(s) / UC Diagnoses   Final diagnoses:  Trichomonas infection  Chlamydia  HSV (herpes simplex virus) infection  Routine screening for STI (sexually transmitted infection)     Discharge Instructions     -For trichomonas and BV, Flagyl (metronidazole), twice daily for 7 days.  Make sure to avoid alcohol while taking these medications because it will cause severe nausea and vomiting. -For chlamydia, doxycycline twice daily for 7 days.   -For suspected genital herpes, Valtrex twice daily for 3 days. -Make sure to complete your course of antibiotics, because untreated trichomonas and chlamydia can cause pelvic inflammatory disease, and this infection can decrease your fertility later in life. -Seek additional immediate medical attention if you develop new symptoms like worsening abdominal pain, back pain, fever/chills despite treatment.     ED Prescriptions    Medication Sig Dispense Auth. Provider   doxycycline (VIBRAMYCIN) 100 MG capsule Take 1 capsule (100 mg total) by mouth 2 (two) times daily for 7 days. 14 capsule Rhys MartiniGraham, Jesus Poplin E, PA-C   metroNIDAZOLE (FLAGYL) 500 MG tablet Take 1 tablet (500 mg total) by mouth 2 (two) times daily for 7 days. 14 tablet Rhys MartiniGraham, Cort Dragoo E, PA-C   valACYclovir (VALTREX) 500 MG tablet Take 1 tablet (500 mg total) by mouth 2 (two) times daily for 3 days. 6 tablet Rhys MartiniGraham, Cystal Shannahan E, PA-C     PDMP not reviewed this encounter.   Rhys MartiniGraham, Kiyoko Mcguirt E, PA-C  07/02/20 2109    Rhys Martini, PA-C 07/04/20 [phone]

## 2020-07-02 NOTE — ED Triage Notes (Signed)
Pt reports burning sensation started today; 1 bump around the vagina x 1-2 days, soreness to touch.   Pt requested STD's testing.

## 2020-07-03 LAB — CERVICOVAGINAL ANCILLARY ONLY
Bacterial Vaginitis (gardnerella): NEGATIVE
Candida Glabrata: NEGATIVE
Candida Vaginitis: NEGATIVE
Chlamydia: NEGATIVE
Comment: NEGATIVE
Comment: NEGATIVE
Comment: NEGATIVE
Comment: NEGATIVE
Comment: NEGATIVE
Comment: NORMAL
Neisseria Gonorrhea: NEGATIVE
Trichomonas: NEGATIVE

## 2020-07-05 LAB — HSV CULTURE AND TYPING

## 2020-10-10 ENCOUNTER — Ambulatory Visit
Admission: EM | Admit: 2020-10-10 | Discharge: 2020-10-10 | Disposition: A | Discharge status: Home / Self Care | Payer: Medicaid Other | Attending: Emergency Medicine | Admitting: Emergency Medicine

## 2020-10-10 ENCOUNTER — Encounter: Payer: Self-pay | Admitting: Emergency Medicine

## 2020-10-10 ENCOUNTER — Other Ambulatory Visit: Payer: Self-pay

## 2020-10-10 DIAGNOSIS — H6122 Impacted cerumen, left ear: Secondary | ICD-10-CM | POA: Diagnosis not present

## 2020-10-10 NOTE — ED Provider Notes (Signed)
[facility]    CSN: [id number] Arrival date & time: 10/10/20  1241      History   Chief Complaint Chief Complaint  Patient presents with   Otalgia   Foot Pain    HPI Hannah Sherman is a 20 y.o. female history of asthma, presenting today for evaluation of left ear pain.  Reports decreased hearing muffled and pop sensation on left ear intermittently over the past couple of months.  Recently has been more persistent.  As well as she has recently developed associated pain.  Denies any recent URI symptoms of congestion or sinus pressure.  Denies symptoms on right side.  She expresses concern over possible fluid on ear as she reports that she will have intermittent swelling in her feet after being on her feet.  Otherwise denies specific concerns regarding her feet at this time.  Does report commonly wearing air pod on left.   HPI  Past Medical History:  Diagnosis Date   Asthma    Hypertension    during pregnancy     Patient Active Problem List   Diagnosis Date Noted   Bleeding in early pregnancy 06/02/2018    Past Surgical History:  Procedure Laterality Date   ABDOMINAL SURGERY     TONSILLECTOMY AND ADENOIDECTOMY  2003    OB History     Gravida  4   Para  1   Term  0   Preterm  1   AB  2   Living  1      SAB  2   IAB  0   Ectopic  0   Multiple  0   Live Births  1            Home Medications    Prior to Admission medications   Medication Sig Start Date End Date Taking? Authorizing Provider  albuterol (VENTOLIN HFA) 108 (90 Base) MCG/ACT inhaler Inhale 1-2 puffs into the lungs every 6 (six) hours as needed for wheezing or shortness of breath. 03/03/20   Maxwell Caul, PA-C    Family History Family History  Problem Relation Age of Onset   Hypertension Maternal Grandmother    Healthy Mother    Healthy Father     Social History Social History   Tobacco Use   Smoking status: Never   Smokeless tobacco: Never  Vaping Use    Vaping Use: Never used  Substance Use Topics   Alcohol use: No    Alcohol/week: 0.0 standard drinks   Drug use: No     Allergies   Patient has no known allergies.   Review of Systems Review of Systems  Constitutional:  Negative for activity change, appetite change, chills, fatigue and fever.  HENT:  Positive for ear pain and hearing loss. Negative for congestion, rhinorrhea, sinus pressure, sore throat and trouble swallowing.   Eyes:  Negative for discharge and redness.  Respiratory:  Negative for cough, chest tightness and shortness of breath.   Cardiovascular:  Negative for chest pain.  Gastrointestinal:  Negative for abdominal pain, diarrhea, nausea and vomiting.  Musculoskeletal:  Negative for myalgias.  Skin:  Negative for rash.  Neurological:  Negative for dizziness, light-headedness and headaches.    Physical Exam Triage Vital Signs ED Triage Vitals  Enc Vitals Group     BP 10/10/20 1343 125/75     Pulse Rate 10/10/20 1343 74     Resp 10/10/20 1343 12     Temp 10/10/20 1343 98.8 F (37.1  C)     Temp Source 10/10/20 1343 Oral     SpO2 10/10/20 1343 97 %     Weight --      Height --      Head Circumference --      Peak Flow --      Pain Score 10/10/20 1340 6     Pain Loc --      Pain Edu? --      Excl. in GC? --    No data found.  Updated Vital Signs BP 125/75 (BP Location: Left Arm)   Pulse 74   Temp 98.8 F (37.1 C) (Oral)   Resp 12   LMP 09/21/2020 (Exact Date)   SpO2 97%   Breastfeeding No   Visual Acuity Right Eye Distance:   Left Eye Distance:   Bilateral Distance:    Right Eye Near:   Left Eye Near:    Bilateral Near:     Physical Exam Vitals and nursing note reviewed.  Constitutional:      Appearance: She is well-developed.     Comments: No acute distress  HENT:     Head: Normocephalic and atraumatic.     Ears:     Comments: Cerumen impaction to left ear, after removal canal clear without erythema or swelling, TM intact with  good bony landmarks, cone of light, no effusion noted    Nose: Nose normal.  Eyes:     Conjunctiva/sclera: Conjunctivae normal.  Cardiovascular:     Rate and Rhythm: Normal rate.  Pulmonary:     Effort: Pulmonary effort is normal. No respiratory distress.  Abdominal:     General: There is no distension.  Musculoskeletal:        General: Normal range of motion.     Cervical back: Neck supple.     Comments: No appreciable swelling noted to feet/lower legs bilaterally  Skin:    General: Skin is warm and dry.  Neurological:     Mental Status: She is alert and oriented to person, place, and time.     UC Treatments / Results  Labs (all labs ordered are listed, but only abnormal results are displayed) Labs Reviewed - No data to display  EKG   Radiology No results found.  Procedures Procedures (including critical care time)  Medications Ordered in UC Medications - No data to display  Initial Impression / Assessment and Plan / UC Course  I have reviewed the triage vital signs and the nursing notes.  Pertinent labs & imaging results that were available during my care of the patient were reviewed by me and considered in my medical decision making (see chart for details).     Left cerumen impaction-irrigation by nursing staff with complete resolution and improvement in hearing.  Did discuss use of antihistamine/Flonase for any possible underlying eustachian tube dysfunction.  Discussed strict return precautions. Patient verbalized understanding and is agreeable with plan.  Final Clinical Impressions(s) / UC Diagnoses   Final diagnoses:  Hearing loss of left ear due to cerumen impaction   Discharge Instructions   None    ED Prescriptions   None    PDMP not reviewed this encounter.   Lew Dawes, PA-C 10/10/20 1438

## 2020-10-10 NOTE — ED Triage Notes (Signed)
Patient c/o LFT ear pain x 2 months.   Patient denies fever.  Patient endorses tenderness upon palpation.   Patient endorses hearing difficulty.   Patient hasn't taken any medications for symptoms.   Patient c/o bilateral foot pain x 1 month.  Patient endorses " I've been having on and off fluid in my feet".   Patient endorses "this happened before when I was pregnant".   Patient denies any changes in fluid dependent on standing or sitting.   Patient hasn't taken any medications for symptoms.

## 2021-10-13 ENCOUNTER — Encounter (HOSPITAL_BASED_OUTPATIENT_CLINIC_OR_DEPARTMENT_OTHER): Payer: Self-pay

## 2021-10-13 ENCOUNTER — Emergency Department (HOSPITAL_BASED_OUTPATIENT_CLINIC_OR_DEPARTMENT_OTHER)
Admission: EM | Admit: 2021-10-13 | Discharge: 2021-10-13 | Disposition: A | Discharge status: Home / Self Care | Payer: Medicaid Other | Attending: Emergency Medicine | Admitting: Emergency Medicine

## 2021-10-13 ENCOUNTER — Other Ambulatory Visit: Payer: Self-pay

## 2021-10-13 DIAGNOSIS — I10 Essential (primary) hypertension: Secondary | ICD-10-CM | POA: Insufficient documentation

## 2021-10-13 DIAGNOSIS — U071 COVID-19: Secondary | ICD-10-CM | POA: Diagnosis not present

## 2021-10-13 DIAGNOSIS — R509 Fever, unspecified: Secondary | ICD-10-CM | POA: Diagnosis present

## 2021-10-13 DIAGNOSIS — J45909 Unspecified asthma, uncomplicated: Secondary | ICD-10-CM | POA: Diagnosis not present

## 2021-10-13 LAB — URINALYSIS, ROUTINE W REFLEX MICROSCOPIC
Bilirubin Urine: NEGATIVE
Glucose, UA: NEGATIVE mg/dL
Hgb urine dipstick: NEGATIVE
Ketones, ur: NEGATIVE mg/dL
Nitrite: NEGATIVE
Protein, ur: NEGATIVE mg/dL
Specific Gravity, Urine: 1.011 (ref 1.005–1.030)
pH: 7 (ref 5.0–8.0)

## 2021-10-13 LAB — CBC
HCT: 37.3 % (ref 36.0–46.0)
Hemoglobin: 12.3 g/dL (ref 12.0–15.0)
MCH: 28.9 pg (ref 26.0–34.0)
MCHC: 33 g/dL (ref 30.0–36.0)
MCV: 87.6 fL (ref 80.0–100.0)
Platelets: 237 10*3/uL (ref 150–400)
RBC: 4.26 MIL/uL (ref 3.87–5.11)
RDW: 13.4 % (ref 11.5–15.5)
WBC: 4.2 10*3/uL (ref 4.0–10.5)
nRBC: 0 % (ref 0.0–0.2)

## 2021-10-13 LAB — BASIC METABOLIC PANEL
Anion gap: 7 (ref 5–15)
BUN: 10 mg/dL (ref 6–20)
CO2: 26 mmol/L (ref 22–32)
Calcium: 8.8 mg/dL — ABNORMAL LOW (ref 8.9–10.3)
Chloride: 102 mmol/L (ref 98–111)
Creatinine, Ser: 0.73 mg/dL (ref 0.44–1.00)
GFR, Estimated: >60 mL/min (ref >60)
Glucose, Bld: 98 mg/dL (ref 70–99)
Potassium: 3.8 mmol/L (ref 3.5–5.1)
Sodium: 135 mmol/L (ref 135–145)

## 2021-10-13 LAB — PREGNANCY, URINE: Preg Test, Ur: NEGATIVE

## 2021-10-13 LAB — RESP PANEL BY RT-PCR (FLU A&B, COVID) ARPGX2
Influenza A by PCR: NEGATIVE
Influenza B by PCR: NEGATIVE
SARS Coronavirus 2 by RT PCR: POSITIVE — AB

## 2021-10-13 LAB — GROUP A STREP BY PCR: Group A Strep by PCR: NOT DETECTED

## 2021-10-13 MED ORDER — ACETAMINOPHEN 325 MG PO TABS
650.0000 mg | ORAL_TABLET | Freq: Once | ORAL | Status: AC | PRN
  Administered 2021-10-13: 650 mg via ORAL
  Filled 2021-10-13: qty 2

## 2021-10-13 MED ORDER — SODIUM CHLORIDE 0.9 % IV BOLUS
1500.0000 mL | Freq: Once | INTRAVENOUS | Status: AC
Start: 2021-10-13 — End: 2021-10-13
  Administered 2021-10-13: 1500 mL via INTRAVENOUS

## 2021-10-13 MED ORDER — ALBUTEROL SULFATE HFA 108 (90 BASE) MCG/ACT IN AERS: 1.0000 | INHALATION_SPRAY | Freq: Four times a day (QID) | RESPIRATORY_TRACT | 0 refills | Status: AC | PRN

## 2021-10-13 MED ORDER — PAXLOVID (300/100) 20 X 150 MG & 10 X 100MG PO TBPK: ORAL_TABLET | ORAL | 0 refills | Status: DC

## 2021-10-13 NOTE — ED Provider Notes (Signed)
[facility] EMERGENCY DEPT Provider Note  CSN: [id number] Arrival date & time: 10/13/21 1903  Chief Complaint(s) Loss of Consciousness  HPI Hannah Sherman is a 21 y.o. female with history of asthma presenting to the emergency department with syncope.  Patient reports that today she had generalized weakness, fevers, chills, dizziness described as a lightheaded sensation, headache, myalgias, sore throat, congestion.  She also reports dry cough.  Symptoms have been present for 1 day.  She reports that she has a sick contact with her daughter.  She reports that today, she was sitting for a prolonged period, stood up and then felt lightheaded and had a syncopal event.  She fell forward.  It lasted about 2 minutes.  She denies nausea, vomiting since falling.  She denies numbness or tingling.  Does not think she hit her head.   Past Medical History Past Medical History:  Diagnosis Date   Asthma    Hypertension    during pregnancy    Patient Active Problem List   Diagnosis Date Noted   Bleeding in early pregnancy 06/02/2018   Home Medication(s) Prior to Admission medications   Medication Sig Start Date End Date Taking? Authorizing Provider  nirmatrelvir & ritonavir (PAXLOVID, 300/100,) 20 x 150 MG & 10 x 100MG  TBPK 300 mg nirmatrelvir (two 150 mg tablets) with 100 mg ritonavir (one 100 mg tablet) with all 3 tablets taken together orally twice daily for 5 days. 10/13/21  Yes 10/15/21, MD  albuterol (VENTOLIN HFA) 108 (90 Base) MCG/ACT inhaler Inhale 1-2 puffs into the lungs every 6 (six) hours as needed for wheezing or shortness of breath. 10/13/21   10/15/21, MD                                                                                                                                    Past Surgical History Past Surgical History:  Procedure Laterality Date   ABDOMINAL SURGERY     TONSILLECTOMY AND ADENOIDECTOMY  2003   Family History Family  History  Problem Relation Age of Onset   Hypertension Maternal Grandmother    Healthy Mother    Healthy Father     Social History Social History   Tobacco Use   Smoking status: Never   Smokeless tobacco: Never  Vaping Use   Vaping Use: Never used  Substance Use Topics   Alcohol use: No    Alcohol/week: 0.0 standard drinks of alcohol   Drug use: No   Allergies Patient has no known allergies.  Review of Systems Review of Systems  All other systems reviewed and are negative.   Physical Exam Vital Signs  I have reviewed the triage vital signs BP (!) 155/105   Pulse (!) 110   Temp (!) 103.2 F (39.6 C)   Resp (!) 26   Ht 5\' 4"  (1.626 m)   Wt 90.7 kg   LMP 10/04/2021  SpO2 97%   BMI 34.33 kg/m  Physical Exam Vitals and nursing note reviewed.  Constitutional:      General: She is not in acute distress.    Appearance: She is well-developed.  HENT:     Head: Normocephalic and atraumatic.     Mouth/Throat:     Mouth: Mucous membranes are moist.  Eyes:     Pupils: Pupils are equal, round, and reactive to light.  Cardiovascular:     Rate and Rhythm: Regular rhythm. Tachycardia present.     Heart sounds: No murmur heard. Pulmonary:     Effort: Pulmonary effort is normal. No respiratory distress.     Breath sounds: Normal breath sounds.  Abdominal:     General: Abdomen is flat.     Palpations: Abdomen is soft.     Tenderness: There is no abdominal tenderness.  Musculoskeletal:        General: No tenderness.     Right lower leg: No edema.     Left lower leg: No edema.  Skin:    General: Skin is warm and dry.  Neurological:     General: No focal deficit present.     Mental Status: She is alert. Mental status is at baseline.  Psychiatric:        Mood and Affect: Mood normal.        Behavior: Behavior normal.     ED Results and Treatments Labs (all labs ordered are listed, but only abnormal results are displayed) Labs Reviewed  RESP PANEL BY RT-PCR  (FLU A&B, COVID) ARPGX2 - Abnormal; Notable for the following components:      Result Value   SARS Coronavirus 2 by RT PCR POSITIVE (*)    All other components within normal limits  BASIC METABOLIC PANEL - Abnormal; Notable for the following components:   Calcium 8.8 (*)    All other components within normal limits  URINALYSIS, ROUTINE W REFLEX MICROSCOPIC - Abnormal; Notable for the following components:   Leukocytes,Ua TRACE (*)    All other components within normal limits  GROUP A STREP BY PCR  CBC  PREGNANCY, URINE  CBG MONITORING, ED                                                                                                                          Radiology No results found.  Pertinent labs & imaging results that were available during my care of the patient were reviewed by me and considered in my medical decision making (see MDM for details).  Medications Ordered in ED Medications  acetaminophen (TYLENOL) tablet 650 mg (650 mg Oral Given 10/13/21 1923)  sodium chloride 0.9 % bolus 1,500 mL (1,500 mLs Intravenous New Bag/Given 10/13/21 2003)  Procedures Procedures  (including critical care time)  Medical Decision Making / ED Course   MDM:  21 year old female presenting to the emergency department with syncope in the setting of illness.  Patient well-appearing, no signs notable for tachycardia, fever.  COVID test positive.  Suspect orthostatic syncope in the setting of COVID infection.  Symptoms consistent with viral infection.  Exam otherwise reassuring.  EKG with sinus tachycardia without sign of proarrhythmic condition such as HOCUM, Brugada, ARVD, WPW, long Qtc.  Doubt cardiac cause of syncope.  No seizure activity reported to suggest neurologic cause of syncope or loss of consciousness.  She denies Any worsening headache after falling.   Will rehydrate, give Tylenol, reassess, likely discharge with self-care at home.  Given obesity likely discharge with Paxlovid.       Additional history obtained: -Additional history obtained from coworker -External records from outside source obtained and reviewed including: Chart review including previous notes, labs, imaging, consultation notes   Lab Tests: -I ordered, reviewed, and interpreted labs.   The pertinent results include:   Labs Reviewed  RESP PANEL BY RT-PCR (FLU A&B, COVID) ARPGX2 - Abnormal; Notable for the following components:      Result Value   SARS Coronavirus 2 by RT PCR POSITIVE (*)    All other components within normal limits  BASIC METABOLIC PANEL - Abnormal; Notable for the following components:   Calcium 8.8 (*)    All other components within normal limits  URINALYSIS, ROUTINE W REFLEX MICROSCOPIC - Abnormal; Notable for the following components:   Leukocytes,Ua TRACE (*)    All other components within normal limits  GROUP A STREP BY PCR  CBC  PREGNANCY, URINE  CBG MONITORING, ED      EKG   EKG Interpretation  Date/Time:  Monday October 13 2021 19:15:58 EDT Ventricular Rate:  103 PR Interval:  156 QRS Duration: 82 QT Interval:  312 QTC Calculation: 408 R Axis:   82 Text Interpretation: Sinus tachycardia Otherwise normal ECG When compared with ECG of 03-Mar-2020 21:59, PREVIOUS ECG IS PRESENT Confirmed by Alvino Blood ([id number]) on 10/13/2021 7:56:41 PM         Medicines ordered and prescription drug management: Meds ordered this encounter  Medications   acetaminophen (TYLENOL) tablet 650 mg   sodium chloride 0.9 % bolus 1,500 mL   nirmatrelvir & ritonavir (PAXLOVID, 300/100,) 20 x 150 MG & 10 x 100MG  TBPK    Sig: 300 mg nirmatrelvir (two 150 mg tablets) with 100 mg ritonavir (one 100 mg tablet) with all 3 tablets taken together orally twice daily for 5 days.    Dispense:  30 each    Refill:  0   albuterol (VENTOLIN HFA) 108 (90  Base) MCG/ACT inhaler    Sig: Inhale 1-2 puffs into the lungs every 6 (six) hours as needed for wheezing or shortness of breath.    Dispense:  8 g    Refill:  0    -I have reviewed the patients home medicines and have made adjustments as needed  Cardiac Monitoring: The patient was maintained on a cardiac monitor.  I personally viewed and interpreted the cardiac monitored which showed an underlying rhythm of: sinus tachycardia  Social Determinants of Health:  Factors impacting patients care include: obesity   Reevaluation: After the interventions noted above, I reevaluated the patient and found that they have :improved  Co morbidities that complicate the patient evaluation  Past Medical History:  Diagnosis Date   Asthma    Hypertension  during pregnancy       Dispostion: Discharge     Final Clinical Impression(s) / ED Diagnoses Final diagnoses:  COVID-19     This chart was dictated using voice recognition software.  Despite best efforts to proofread,  errors can occur which can change the documentation meaning.    Lonell Grandchild, MD 10/13/21 [phone]

## 2021-10-13 NOTE — Discharge Instructions (Addendum)
We evaluated you today in the emergency department for your fainting.  Your tests were positive for COVID 19.  You probably fainted because you are very dehydrated and have a high fever.  Your testing was reassuring.  Your symptoms improved in the emergency department.  For your fever at home, please take Tylenol 650 mg every 6 hours, or Motrin 600 mg every 6 hours, as needed.  You can take both of these medications together.  You can also take other medicine such as Mucinex.  You can take your asthma inhaler as needed for wheezing.  There was no sign of an asthma exacerbation currently in the emergency department.  Please keep a close eye on your symptoms.  If you experience any worsening symptoms such as difficulty breathing, inability to tolerate food by mouth due to vomiting, severe weakness or recurrent episodes of fainting, please return to the emergency department.

## 2021-10-13 NOTE — ED Notes (Signed)
Reviewed AVS/discharge instruction with patient. Time allotted for and all questions answered. Patient is agreeable for d/c and escorted to ed exit by staff.  

## 2021-10-13 NOTE — ED Notes (Signed)
Patient ambulated to restroom. Steady gait no distess noted

## 2021-10-13 NOTE — ED Triage Notes (Signed)
Pt presents to the ED with dizziness, fevers, chills, loss of appetite, fatigue, headache, sore throat, and body aches. States that today she had a syncopal episode at work. Pt reports witnessed LOC. Coworker states that the syncopal episode lasted about two minutes. Temp 103.2 at time of triage. EKG obtained. Pt A&Ox4 at time of triage.

## 2021-12-11 ENCOUNTER — Other Ambulatory Visit: Payer: Self-pay

## 2021-12-11 ENCOUNTER — Encounter (HOSPITAL_BASED_OUTPATIENT_CLINIC_OR_DEPARTMENT_OTHER): Payer: Self-pay | Admitting: Emergency Medicine

## 2021-12-11 ENCOUNTER — Emergency Department (HOSPITAL_BASED_OUTPATIENT_CLINIC_OR_DEPARTMENT_OTHER)
Admission: EM | Admit: 2021-12-11 | Discharge: 2021-12-11 | Disposition: A | Discharge status: Home / Self Care | Payer: Medicaid Other | Attending: Emergency Medicine | Admitting: Emergency Medicine

## 2021-12-11 DIAGNOSIS — R8290 Unspecified abnormal findings in urine: Secondary | ICD-10-CM

## 2021-12-11 DIAGNOSIS — D72829 Elevated white blood cell count, unspecified: Secondary | ICD-10-CM | POA: Insufficient documentation

## 2021-12-11 DIAGNOSIS — R82998 Other abnormal findings in urine: Secondary | ICD-10-CM | POA: Diagnosis present

## 2021-12-11 LAB — URINALYSIS, ROUTINE W REFLEX MICROSCOPIC
Bilirubin Urine: NEGATIVE
Glucose, UA: NEGATIVE mg/dL
Hgb urine dipstick: NEGATIVE
Ketones, ur: NEGATIVE mg/dL
Nitrite: NEGATIVE
Specific Gravity, Urine: 1.033 — ABNORMAL HIGH (ref 1.005–1.030)
pH: 6.5 (ref 5.0–8.0)

## 2021-12-11 LAB — PREGNANCY, URINE: Preg Test, Ur: NEGATIVE

## 2021-12-11 NOTE — ED Provider Notes (Signed)
[facility] EMERGENCY DEPT Provider Note   CSN: [id number] Arrival date & time: 12/11/21  0310     History  Chief Complaint  Patient presents with   Dysuria    Hannah Sherman is a 21 y.o. female.  21 yo F with a chief complaint of her urine being darker than normal and having a different smell.  She noticed this this evening.  She was here with her daughter and she thought she would get her urine checked out.  Has had urinary tract infection with symptoms like this before.  She denies any dysuria increased frequency or hesitancy.  Denies flank pain.  Denies abdominal pain.   Dysuria      Home Medications Prior to Admission medications   Medication Sig Start Date End Date Taking? Authorizing Provider  albuterol (VENTOLIN HFA) 108 (90 Base) MCG/ACT inhaler Inhale 1-2 puffs into the lungs every 6 (six) hours as needed for wheezing or shortness of breath. 10/13/21   Cristie Hem, MD  nirmatrelvir & ritonavir (PAXLOVID, 300/100,) 20 x 150 MG & 10 x 100MG  TBPK 300 mg nirmatrelvir (two 150 mg tablets) with 100 mg ritonavir (one 100 mg tablet) with all 3 tablets taken together orally twice daily for 5 days. 10/13/21   Cristie Hem, MD      Allergies    Patient has no known allergies.    Review of Systems   Review of Systems  Genitourinary:  Positive for dysuria.    Physical Exam Updated Vital Signs BP (!) 113/91 (BP Location: Right Arm)   Pulse 72   Temp 98 F (36.7 C) (Oral)   Resp 18   Wt 90 kg   LMP 12/07/2021   SpO2 99%   BMI 34.06 kg/m  Physical Exam Vitals and nursing note reviewed.  Constitutional:      General: She is not in acute distress.    Appearance: She is well-developed. She is not diaphoretic.     Comments: BMI 35  HENT:     Head: Normocephalic and atraumatic.  Eyes:     Pupils: Pupils are equal, round, and reactive to light.  Cardiovascular:     Rate and Rhythm: Normal rate and regular rhythm.     Heart sounds: No  murmur heard.    No friction rub. No gallop.  Pulmonary:     Effort: Pulmonary effort is normal.     Breath sounds: No wheezing or rales.  Abdominal:     General: There is no distension.     Palpations: Abdomen is soft.     Tenderness: There is no abdominal tenderness.  Musculoskeletal:        General: No tenderness.     Cervical back: Normal range of motion and neck supple.  Skin:    General: Skin is warm and dry.  Neurological:     Mental Status: She is alert and oriented to person, place, and time.  Psychiatric:        Behavior: Behavior normal.     ED Results / Procedures / Treatments   Labs (all labs ordered are listed, but only abnormal results are displayed) Labs Reviewed  URINALYSIS, ROUTINE W REFLEX MICROSCOPIC - Abnormal; Notable for the following components:      Result Value   Specific Gravity, Urine 1.033 (*)    Protein, ur TRACE (*)    Leukocytes,Ua SMALL (*)    Bacteria, UA RARE (*)    All other components within normal limits  PREGNANCY, URINE  EKG None  Radiology No results found.  Procedures Procedures    Medications Ordered in ED Medications - No data to display  ED Course/ Medical Decision Making/ A&P                           Medical Decision Making Amount and/or Complexity of Data Reviewed Labs: ordered.   21 yo F with a chief complaint of dark and smelly urine.  This was noticed this evening.  She was bring her daughter here for uri symptoms and thought she would get her urine tested while she was here.  UA with small leukocyte Estrace negative nitrites, rare bacteria.  Does appear to be concentrated with a high specific gravity.  Pregnancy test negative.  Will discharge patient home.  PCP follow-up.  3:56 AM:  I have discussed the diagnosis/risks/treatment options with the patient.  Evaluation and diagnostic testing in the emergency department does not suggest an emergent condition requiring admission or immediate intervention  beyond what has been performed at this time.  They will follow up with PCP. We also discussed returning to the ED immediately if new or worsening sx occur. We discussed the sx which are most concerning (e.g., sudden worsening pain, fever, inability to tolerate by mouth) that necessitate immediate return. Medications administered to the patient during their visit and any new prescriptions provided to the patient are listed below.  Medications given during this visit Medications - No data to display   The patient appears reasonably screen and/or stabilized for discharge and I doubt any other medical condition or other [facility] requiring further screening, evaluation, or treatment in the ED at this time prior to discharge.          Final Clinical Impression(s) / ED Diagnoses Final diagnoses:  Foul smelling urine    Rx / DC Orders ED Discharge Orders     None         Deno Etienne, DO 12/11/21 [phone]

## 2021-12-11 NOTE — Discharge Instructions (Signed)
Your urine appears concentrated and shows no signs of infection.  Try to increase your water intake at home.  Please follow-up with your family doctor in the office.  If you do not have a family doctor there is a family practice clinic in this building.  I provided their information in this paperwork for you to try and set up an appointment.

## 2021-12-11 NOTE — ED Triage Notes (Signed)
Pt reports strong urine odor and dark urine color x 2 days. Denies back pain, abd pain, fever, chills, burning urine, vaginal discharge.

## 2022-01-30 ENCOUNTER — Other Ambulatory Visit: Payer: Self-pay

## 2022-01-30 ENCOUNTER — Encounter (HOSPITAL_BASED_OUTPATIENT_CLINIC_OR_DEPARTMENT_OTHER): Payer: Self-pay | Admitting: Emergency Medicine

## 2022-01-30 ENCOUNTER — Emergency Department (HOSPITAL_BASED_OUTPATIENT_CLINIC_OR_DEPARTMENT_OTHER)
Admission: EM | Admit: 2022-01-30 | Discharge: 2022-01-31 | Disposition: A | Discharge status: Home / Self Care | Payer: Medicaid Other | Attending: Emergency Medicine | Admitting: Emergency Medicine

## 2022-01-30 DIAGNOSIS — K0889 Other specified disorders of teeth and supporting structures: Secondary | ICD-10-CM | POA: Diagnosis present

## 2022-01-30 DIAGNOSIS — Z7951 Long term (current) use of inhaled steroids: Secondary | ICD-10-CM | POA: Insufficient documentation

## 2022-01-30 DIAGNOSIS — J45909 Unspecified asthma, uncomplicated: Secondary | ICD-10-CM | POA: Diagnosis not present

## 2022-01-30 NOTE — ED Triage Notes (Signed)
Left lower side mouth/teeth pain. Started 2 weeks ago. Partial root canal was completed, uncontrolled pain today.

## 2022-01-31 MED ORDER — HYDROCODONE-ACETAMINOPHEN 5-325 MG PO TABS: 1.0000 | ORAL_TABLET | Freq: Four times a day (QID) | ORAL | 0 refills | Status: DC | PRN

## 2022-01-31 MED ORDER — AMOXICILLIN 500 MG PO CAPS: 500.0000 mg | ORAL_CAPSULE | Freq: Three times a day (TID) | ORAL | 0 refills | Status: DC

## 2022-01-31 MED ORDER — AMOXICILLIN 500 MG PO CAPS
500.0000 mg | ORAL_CAPSULE | Freq: Once | ORAL | Status: AC
  Administered 2022-01-31: 500 mg via ORAL
  Filled 2022-01-31: qty 1

## 2022-01-31 MED ORDER — OXYCODONE-ACETAMINOPHEN 5-325 MG PO TABS
2.0000 | ORAL_TABLET | Freq: Once | ORAL | Status: AC
  Administered 2022-01-31: 2 via ORAL
  Filled 2022-01-31: qty 2

## 2022-01-31 NOTE — ED Provider Notes (Signed)
[facility] EMERGENCY DEPT Provider Note   CSN: [id number] Arrival date & time: 01/30/22  2327     History  Chief Complaint  Patient presents with   Dental Pain    Hannah Sherman is a 21 y.o. female.  Patient is a 21 year old female with history of asthma.  Patient presenting with complaints of dental pain.  She reports undergoing a partial root canal 2 weeks ago and is due to see her dentist again on Monday.  She describes pain worsening since earlier today.  She describes a constant ache to the left lower first molar, but no facial swelling or difficulty breathing or swallowing.  She has been taking Tylenol and ibuprofen at home with little relief.  The history is provided by the patient.  Dental Pain Location:  Lower Lower teeth location:  19/LL 1st molar      Home Medications Prior to Admission medications   Medication Sig Start Date End Date Taking? Authorizing Provider  albuterol (VENTOLIN HFA) 108 (90 Base) MCG/ACT inhaler Inhale 1-2 puffs into the lungs every 6 (six) hours as needed for wheezing or shortness of breath. 10/13/21   Cristie Hem, MD  nirmatrelvir & ritonavir (PAXLOVID, 300/100,) 20 x 150 MG & 10 x 100MG  TBPK 300 mg nirmatrelvir (two 150 mg tablets) with 100 mg ritonavir (one 100 mg tablet) with all 3 tablets taken together orally twice daily for 5 days. 10/13/21   Cristie Hem, MD      Allergies    Patient has no known allergies.    Review of Systems   Review of Systems  All other systems reviewed and are negative.   Physical Exam Updated Vital Signs BP (!) 159/111 (BP Location: Right Arm)   Pulse 83   Temp 98.9 F (37.2 C) (Oral)   Resp 18   LMP 01/04/2022   SpO2 100%  Physical Exam Vitals and nursing note reviewed.  Constitutional:      General: She is not in acute distress.    Appearance: Normal appearance. She is not ill-appearing.  HENT:     Head: Normocephalic and atraumatic.     Mouth/Throat:      Comments: The left lower first molar has an area where cement has been applied.  There is mild surrounding gingival inflammation, but no obvious abscess or significant swelling. Pulmonary:     Effort: Pulmonary effort is normal.  Skin:    General: Skin is warm and dry.  Neurological:     Mental Status: She is alert.     ED Results / Procedures / Treatments   Labs (all labs ordered are listed, but only abnormal results are displayed) Labs Reviewed - No data to display  EKG None  Radiology No results found.  Procedures Procedures    Medications Ordered in ED Medications  oxyCODONE-acetaminophen (PERCOCET/ROXICET) 5-325 MG per tablet 2 tablet (has no administration in time range)  amoxicillin (AMOXIL) capsule 500 mg (has no administration in time range)    ED Course/ Medical Decision Making/ A&P  Patient presenting with complaints of dental pain as described in the HPI.    Nothing on exam to suggest abscess or other complicating factor.  I will prescribe amoxicillin and pain medication.  Patient to follow-up with her dentist on Monday as scheduled.  Final Clinical Impression(s) / ED Diagnoses Final diagnoses:  None    Rx / DC Orders ED Discharge Orders     None         Dezirae Service,  Riley Lam, MD 01/31/22 Jacinta Shoe

## 2022-01-31 NOTE — Discharge Instructions (Addendum)
Begin taking amoxicillin as prescribed.  Take ibuprofen 600 mg every 6 hours until followed up by your dentist.  Take hydrocodone as prescribed as needed for pain not relieved with ibuprofen.  Follow-up with your dentist on Monday as scheduled.

## 2022-02-19 ENCOUNTER — Emergency Department (HOSPITAL_BASED_OUTPATIENT_CLINIC_OR_DEPARTMENT_OTHER)
Admission: EM | Admit: 2022-02-19 | Discharge: 2022-02-19 | Discharge status: Against Medical Advice | Payer: Medicaid Other | Attending: Emergency Medicine | Admitting: Emergency Medicine

## 2022-02-19 ENCOUNTER — Other Ambulatory Visit: Payer: Self-pay

## 2022-02-19 ENCOUNTER — Encounter (HOSPITAL_BASED_OUTPATIENT_CLINIC_OR_DEPARTMENT_OTHER): Payer: Self-pay | Admitting: Emergency Medicine

## 2022-02-19 DIAGNOSIS — R630 Anorexia: Secondary | ICD-10-CM | POA: Insufficient documentation

## 2022-02-19 DIAGNOSIS — Z20822 Contact with and (suspected) exposure to covid-19: Secondary | ICD-10-CM | POA: Diagnosis not present

## 2022-02-19 DIAGNOSIS — R059 Cough, unspecified: Secondary | ICD-10-CM | POA: Insufficient documentation

## 2022-02-19 DIAGNOSIS — Z5321 Procedure and treatment not carried out due to patient leaving prior to being seen by health care provider: Secondary | ICD-10-CM | POA: Diagnosis not present

## 2022-02-19 DIAGNOSIS — B349 Viral infection, unspecified: Secondary | ICD-10-CM

## 2022-02-19 DIAGNOSIS — R509 Fever, unspecified: Secondary | ICD-10-CM | POA: Insufficient documentation

## 2022-02-19 LAB — RESP PANEL BY RT-PCR (RSV, FLU A&B, COVID)  RVPGX2
Influenza A by PCR: NEGATIVE
Influenza B by PCR: NEGATIVE
Resp Syncytial Virus by PCR: NEGATIVE
SARS Coronavirus 2 by RT PCR: NEGATIVE

## 2022-02-19 NOTE — ED Triage Notes (Signed)
Pt arrives to ED with c/o cough, fever, runny nose, loss of appetite.

## 2022-02-19 NOTE — ED Notes (Addendum)
Pt left without being seen by the provider... Attempted to get Pt to stay and at least talk to the provider but she had to leave for work... She stated she would come back after work if able... Provider notified. Pt appeared to be stable upon leaving.Marland KitchenMarland Kitchen

## 2022-02-21 NOTE — ED Provider Notes (Signed)
Was unable to evaluate patient since she left without being seen.   Rondel Baton, MD 02/21/22 0600

## 2022-09-22 ENCOUNTER — Ambulatory Visit (HOSPITAL_COMMUNITY)
Admission: EM | Admit: 2022-09-22 | Discharge: 2022-09-22 | Disposition: A | Discharge status: Home / Self Care | Payer: Medicaid Other

## 2022-09-22 ENCOUNTER — Encounter (HOSPITAL_COMMUNITY): Payer: Self-pay

## 2022-09-22 ENCOUNTER — Ambulatory Visit: Payer: Self-pay

## 2022-09-22 NOTE — ED Notes (Signed)
Pt states that she has to go to work and didn't wont to wait around if we aren't able to check her blood for mold testing. This RN confirmed with Dr Marlinda Mike and Marcelino Duster, NP. Pt states that her doctor's office said there is a blood test that can be down. This RN advised patient that she should contact her PCP to order if for her to go to a blab to get drawn.  Pt left and went to work.

## 2022-09-22 NOTE — ED Triage Notes (Signed)
nasal congestion; expose to mold. Patient discovered mold in hr house yesterday. States when home she ha sore throat, headache, and nasal congestion. Patient having to use her asthma inhaler more.   Onset of symptoms 2 months. Wants to known about mold testing.

## 2022-11-07 ENCOUNTER — Emergency Department (HOSPITAL_BASED_OUTPATIENT_CLINIC_OR_DEPARTMENT_OTHER)
Admission: EM | Admit: 2022-11-07 | Discharge: 2022-11-07 | Disposition: A | Discharge status: Home / Self Care | Payer: Medicaid Other | Attending: Emergency Medicine | Admitting: Emergency Medicine

## 2022-11-07 ENCOUNTER — Other Ambulatory Visit: Payer: Self-pay

## 2022-11-07 ENCOUNTER — Encounter (HOSPITAL_BASED_OUTPATIENT_CLINIC_OR_DEPARTMENT_OTHER): Payer: Self-pay | Admitting: Emergency Medicine

## 2022-11-07 DIAGNOSIS — R059 Cough, unspecified: Secondary | ICD-10-CM | POA: Diagnosis present

## 2022-11-07 DIAGNOSIS — Z1152 Encounter for screening for COVID-19: Secondary | ICD-10-CM | POA: Insufficient documentation

## 2022-11-07 DIAGNOSIS — R0981 Nasal congestion: Secondary | ICD-10-CM | POA: Insufficient documentation

## 2022-11-07 LAB — RESP PANEL BY RT-PCR (RSV, FLU A&B, COVID)  RVPGX2
Influenza A by PCR: NEGATIVE
Influenza B by PCR: NEGATIVE
Resp Syncytial Virus by PCR: NEGATIVE
SARS Coronavirus 2 by RT PCR: NEGATIVE

## 2022-11-07 LAB — GROUP A STREP BY PCR: Group A Strep by PCR: NOT DETECTED

## 2022-11-07 MED ORDER — ALUM & MAG HYDROXIDE-SIMETH 200-200-20 MG/5ML PO SUSP
30.0000 mL | Freq: Once | ORAL | Status: AC
  Administered 2022-11-07: 30 mL via ORAL
  Filled 2022-11-07: qty 30

## 2022-11-07 MED ORDER — PSEUDOEPHEDRINE HCL ER 120 MG PO TB12
120.0000 mg | ORAL_TABLET | Freq: Two times a day (BID) | ORAL | Status: DC
Start: 2022-11-07 — End: 2022-11-07

## 2022-11-07 MED ORDER — PSEUDOEPHEDRINE HCL 30 MG PO TABS
60.0000 mg | ORAL_TABLET | Freq: Once | ORAL | Status: AC
  Administered 2022-11-07: 60 mg via ORAL
  Filled 2022-11-07: qty 2

## 2022-11-07 MED ORDER — LIDOCAINE VISCOUS HCL 2 % MT SOLN
15.0000 mL | Freq: Once | OROMUCOSAL | Status: AC
  Administered 2022-11-07: 15 mL via ORAL
  Filled 2022-11-07: qty 15

## 2022-11-07 MED ORDER — DEXAMETHASONE 4 MG PO TABS
16.0000 mg | ORAL_TABLET | Freq: Once | ORAL | Status: AC
  Administered 2022-11-07: 16 mg via ORAL
  Filled 2022-11-07: qty 4

## 2022-11-07 NOTE — ED Triage Notes (Signed)
   Patient comes in with cough and sore throat that has been going on for a few days.  Child has been sick and patient states several people at work have been sick.  No fevers that she is aware of.  Endorses dysphagia.  Pain 3/10, sore throat.

## 2022-11-07 NOTE — ED Provider Notes (Signed)
[place] [facility]GENCY DEPARTMENT AT [facility] Provider Note   CSN: [id number] Arrival date & time: 11/07/22  0118     History  Chief Complaint  Patient presents with   Cough   Sore Throat    Hannah Sherman is a 22 y.o. female.  Here with her daughter who is also sick.  Patient with a few days of nonproductive cough, subjective fever, body aches, decreased energy.  Multiple coworkers with COVID.  Daughters been sick a similar amount of time.   Cough Sore Throat       Home Medications Prior to Admission medications   Medication Sig Start Date End Date Taking? Authorizing Provider  albuterol (VENTOLIN HFA) 108 (90 Base) MCG/ACT inhaler Inhale 1-2 puffs into the lungs every 6 (six) hours as needed for wheezing or shortness of breath. 10/13/21   Lonell Grandchild, MD  amoxicillin (AMOXIL) 500 MG capsule Take 1 capsule (500 mg total) by mouth 3 (three) times daily. 01/31/22   Geoffery Lyons, MD  HYDROcodone-acetaminophen (NORCO) 5-325 MG tablet Take 1-2 tablets by mouth every 6 (six) hours as needed. 01/31/22   Geoffery Lyons, MD  nirmatrelvir & ritonavir (PAXLOVID, 300/100,) 20 x 150 MG & 10 x 100MG  TBPK 300 mg nirmatrelvir (two 150 mg tablets) with 100 mg ritonavir (one 100 mg tablet) with all 3 tablets taken together orally twice daily for 5 days. 10/13/21   Lonell Grandchild, MD      Allergies    Patient has no known allergies.    Review of Systems   Review of Systems  Respiratory:  Positive for cough.     Physical Exam Updated Vital Signs BP 130/71 (BP Location: Left Arm)   Pulse 74   Temp 98.4 F (36.9 C) (Oral)   Resp 18   Ht 5\' 4"  (1.626 m)   Wt (!) 141.7 kg   LMP 11/02/2022 (Approximate)   SpO2 100%   BMI 53.60 kg/m  Physical Exam Vitals and nursing note reviewed.  Constitutional:      Appearance: She is well-developed.  HENT:     Head: Normocephalic and atraumatic.     Right Ear: Tympanic membrane normal.     Left Ear: Tympanic membrane  normal.  Cardiovascular:     Rate and Rhythm: Normal rate and regular rhythm.  Pulmonary:     Effort: No respiratory distress.     Breath sounds: No stridor.  Abdominal:     General: There is no distension.  Musculoskeletal:     Cervical back: Normal range of motion.  Neurological:     Mental Status: She is alert.     ED Results / Procedures / Treatments   Labs (all labs ordered are listed, but only abnormal results are displayed) Labs Reviewed  RESP PANEL BY RT-PCR (RSV, FLU A&B, COVID)  RVPGX2  GROUP A STREP BY PCR    EKG None  Radiology No results found.  Procedures Procedures    Medications Ordered in ED Medications  dexamethasone (DECADRON) tablet 16 mg (16 mg Oral Given 11/07/22 0524)  alum & mag hydroxide-simeth (MAALOX/MYLANTA) 200-200-20 MG/5ML suspension 30 mL (30 mLs Oral Given 11/07/22 0524)    And  lidocaine (XYLOCAINE) 2 % viscous mouth solution 15 mL (15 mLs Oral Given 11/07/22 0524)  pseudoephedrine (SUDAFED) tablet 60 mg (60 mg Oral Given 11/07/22 0557)    ED Course/ Medical Decision Making/ A&P  Medical Decision Making Risk OTC drugs. Prescription drug management.   Overall patient appears well.  Symptoms seem more consistent with a viral infection anything.  Treated symptomatically for the same.  No evidence of pneumonia, sinusitis, strep throat.  COVID flu RSV were also negative.  Daughter with similar symptoms also has negative studies.  Final Clinical Impression(s) / ED Diagnoses Final diagnoses:  Congestion of nasal sinus    Rx / DC Orders ED Discharge Orders     None         Jassmine Vandruff, Barbara Cower, MD 11/07/22 [phone]

## 2022-11-07 NOTE — ED Notes (Signed)
RT assessed pt in triage. Pt BLBS clr/clr dim w/no distress noted at this time. Pt respiratory status stable. Pt w/history of asthma, has not seen pulmonology in several years for reevaluation of asthma symptoms. RT will continue to monitor while @ [facility].    11/07/22 0136  Therapy Vitals  Temp 98.4 F (36.9 C)  Temp Source Oral  Pulse Rate 74  Resp 18  BP 130/71  Patient Position (if appropriate) Sitting  MEWS Score/Color  MEWS Score 0  MEWS Score Color Green  Respiratory Assessment  $ RT Protocol Assessment  Yes  Assessment Type Assess only  Respiratory Pattern Regular;Unlabored;Symmetrical  Chest Assessment Chest expansion symmetrical  Cough None  Bilateral Breath Sounds Clear;Diminished  R Upper  Breath Sounds Clear  L Upper Breath Sounds Clear  R Lower Breath Sounds Clear;Diminished  L Lower Breath Sounds Clear;Diminished  Oxygen Therapy/Pulse Ox  O2 Device Room Air  SpO2 100 %

## 2023-01-08 ENCOUNTER — Ambulatory Visit
Admission: RE | Admit: 2023-01-08 | Discharge: 2023-01-08 | Disposition: A | Discharge status: Home / Self Care | Payer: Medicaid Other | Source: Ambulatory Visit | Attending: Nurse Practitioner | Admitting: Nurse Practitioner

## 2023-01-08 ENCOUNTER — Ambulatory Visit: Payer: Medicaid Other

## 2023-01-08 VITALS — BP 122/85 | HR 62 | Temp 98.4°F | Resp 16

## 2023-01-08 DIAGNOSIS — N390 Urinary tract infection, site not specified: Secondary | ICD-10-CM | POA: Diagnosis present

## 2023-01-08 LAB — POCT URINALYSIS DIP (MANUAL ENTRY)
Bilirubin, UA: NEGATIVE
Glucose, UA: NEGATIVE mg/dL
Ketones, POC UA: NEGATIVE mg/dL
Nitrite, UA: POSITIVE — AB
Protein Ur, POC: NEGATIVE mg/dL
Spec Grav, UA: 1.015 (ref 1.010–1.025)
Urobilinogen, UA: 0.2 U/dL
pH, UA: 6 (ref 5.0–8.0)

## 2023-01-08 MED ORDER — NITROFURANTOIN MONOHYD MACRO 100 MG PO CAPS: 100.0000 mg | ORAL_CAPSULE | Freq: Two times a day (BID) | ORAL | 0 refills | Status: DC

## 2023-01-08 NOTE — ED Provider Notes (Signed)
[facility]    CSN: [id number] Arrival date & time: 01/08/23  1606      History   Chief Complaint Chief Complaint  Patient presents with   Urinary Frequency    Entered by patient    HPI Hannah Sherman is a 22 y.o. female.   HPI She is in today with a 4-week history of urinary frequency.  She reports that she treated her symptoms using over-the-counter AZO.  She reports that her cycle started and is over and now she reports that the urinary frequency has returned.Denies any pelvic pain or tenderness, amenorrhea irregular bleeding or prolonged heavy bleeding.  Denies vaginal discharge or dysuria.  Denies ulcers or lesions  Past Medical History:  Diagnosis Date   Asthma    Hypertension    during pregnancy     Patient Active Problem List   Diagnosis Date Noted   Bleeding in early pregnancy 06/02/2018    Past Surgical History:  Procedure Laterality Date   ABDOMINAL SURGERY     TONSILLECTOMY AND ADENOIDECTOMY  2003    OB History     Gravida  4   Para  1   Term  0   Preterm  1   AB  2   Living  1      SAB  2   IAB  0   Ectopic  0   Multiple  0   Live Births  1            Home Medications    Prior to Admission medications   Medication Sig Start Date End Date Taking? Authorizing Provider  nitrofurantoin, macrocrystal-monohydrate, (MACROBID) 100 MG capsule Take 1 capsule (100 mg total) by mouth 2 (two) times daily. 01/08/23  Yes Barbette Merino, NP  albuterol (VENTOLIN HFA) 108 (90 Base) MCG/ACT inhaler Inhale 1-2 puffs into the lungs every 6 (six) hours as needed for wheezing or shortness of breath. 10/13/21   Lonell Grandchild, MD  amoxicillin (AMOXIL) 500 MG capsule Take 1 capsule (500 mg total) by mouth 3 (three) times daily. 01/31/22   Geoffery Lyons, MD  HYDROcodone-acetaminophen (NORCO) 5-325 MG tablet Take 1-2 tablets by mouth every 6 (six) hours as needed. 01/31/22   Geoffery Lyons, MD  nirmatrelvir & ritonavir (PAXLOVID,  300/100,) 20 x 150 MG & 10 x 100MG  TBPK 300 mg nirmatrelvir (two 150 mg tablets) with 100 mg ritonavir (one 100 mg tablet) with all 3 tablets taken together orally twice daily for 5 days. 10/13/21   Lonell Grandchild, MD    Family History Family History  Problem Relation Age of Onset   Hypertension Maternal Grandmother    Healthy Mother    Healthy Father     Social History Social History   Tobacco Use   Smoking status: Never   Smokeless tobacco: Never  Vaping Use   Vaping status: Never Used  Substance Use Topics   Alcohol use: No    Alcohol/week: 0.0 standard drinks of alcohol   Drug use: No     Allergies   Patient has no known allergies.   Review of Systems Review of Systems   Physical Exam Triage Vital Signs ED Triage Vitals  Encounter Vitals Group     BP 01/08/23 1620 122/85     Systolic BP Percentile --      Diastolic BP Percentile --      Pulse Rate 01/08/23 1620 62     Resp 01/08/23 1620 16  Temp 01/08/23 1620 98.4 F (36.9 C)     Temp Source 01/08/23 1620 Oral     SpO2 01/08/23 1620 98 %     Weight --      Height --      Head Circumference --      Peak Flow --      Pain Score 01/08/23 1628 0     Pain Loc --      Pain Education --      Exclude from Growth Chart --    No data found.  Updated Vital Signs BP 122/85 (BP Location: Right Arm)   Pulse 62   Temp 98.4 F (36.9 C) (Oral)   Resp 16   LMP 01/05/2023 (Exact Date)   SpO2 98%   Visual Acuity Right Eye Distance:   Left Eye Distance:   Bilateral Distance:    Right Eye Near:   Left Eye Near:    Bilateral Near:     Physical Exam Constitutional:      Appearance: She is obese.  Cardiovascular:     Rate and Rhythm: Normal rate.     Pulses: Normal pulses.  Neurological:     Mental Status: She is alert.      UC Treatments / Results  Labs (all labs ordered are listed, but only abnormal results are displayed) Labs Reviewed  POCT URINALYSIS DIP (MANUAL ENTRY) - Abnormal;  Notable for the following components:      Result Value   Color, UA straw (*)    Clarity, UA turbid (*)    Blood, UA trace-intact (*)    Nitrite, UA Positive (*)    Leukocytes, UA Large (3+) (*)    All other components within normal limits  CERVICOVAGINAL ANCILLARY ONLY    EKG   Radiology No results found.  Procedures Procedures (including critical care time)  Medications Ordered in UC Medications - No data to display  Initial Impression / Assessment and Plan / UC Course  I have reviewed the triage vital signs and the nursing notes.  Pertinent labs & imaging results that were available during my care of the patient were reviewed by me and considered in my medical decision making (see chart for details).     Frequency  Final Clinical Impressions(s) / UC Diagnoses   Final diagnoses:  Urinary tract infection without hematuria, site unspecified     Discharge Instructions      Your urinalysis indicates nitrates, along with large leukocytes.  You are being treated for urinary tract infection.  A urine culture will be sent.  You have been prescribed nitrofurantoin 100 mg twice daily for 5 days for the urinary tract infection.  You will be notified of the vaginal swab results if they are positive.  You will also be notified if the urine culture indicates that you need a different antibiotic because it is not sensitive to the the nitrofurantoin.    ED Prescriptions     Medication Sig Dispense Auth. Provider   nitrofurantoin, macrocrystal-monohydrate, (MACROBID) 100 MG capsule Take 1 capsule (100 mg total) by mouth 2 (two) times daily. 10 capsule Barbette Merino, NP      PDMP not reviewed this encounter.   Barbette Merino, NP 01/08/23 [phone]

## 2023-01-08 NOTE — Discharge Instructions (Addendum)
Your urinalysis indicates nitrates, along with large leukocytes.  You are being treated for urinary tract infection.  A urine culture will be sent.  You have been prescribed nitrofurantoin 100 mg twice daily for 5 days for the urinary tract infection.  You will be notified of the vaginal swab results if they are positive.  You will also be notified if the urine culture indicates that you need a different antibiotic because it is not sensitive to the the nitrofurantoin.

## 2023-01-08 NOTE — ED Triage Notes (Signed)
Pt presents with c/o frequency x 3 wks.  Requests STD testing.

## 2023-01-10 LAB — URINE CULTURE: Culture: >=100000 — AB

## 2023-01-11 ENCOUNTER — Telehealth (HOSPITAL_COMMUNITY): Payer: Self-pay | Admitting: Emergency Medicine

## 2023-01-11 LAB — CERVICOVAGINAL ANCILLARY ONLY
Bacterial Vaginitis (gardnerella): POSITIVE — AB
Candida Glabrata: NEGATIVE
Candida Vaginitis: NEGATIVE
Chlamydia: NEGATIVE
Comment: NEGATIVE
Comment: NEGATIVE
Comment: NEGATIVE
Comment: NEGATIVE
Comment: NEGATIVE
Comment: NORMAL
Neisseria Gonorrhea: NEGATIVE
Trichomonas: NEGATIVE

## 2023-01-11 MED ORDER — METRONIDAZOLE 500 MG PO TABS: 500.0000 mg | ORAL_TABLET | Freq: Two times a day (BID) | ORAL | 0 refills | Status: DC

## 2023-01-11 NOTE — Telephone Encounter (Signed)
Metronidazole for positive BV

## 2023-03-07 ENCOUNTER — Encounter (HOSPITAL_BASED_OUTPATIENT_CLINIC_OR_DEPARTMENT_OTHER): Payer: Self-pay | Admitting: Emergency Medicine

## 2023-03-07 ENCOUNTER — Emergency Department (HOSPITAL_BASED_OUTPATIENT_CLINIC_OR_DEPARTMENT_OTHER)
Admission: EM | Admit: 2023-03-07 | Discharge: 2023-03-07 | Discharge status: Against Medical Advice | Payer: Self-pay | Attending: Emergency Medicine | Admitting: Emergency Medicine

## 2023-03-07 ENCOUNTER — Other Ambulatory Visit: Payer: Self-pay

## 2023-03-07 DIAGNOSIS — Z202 Contact with and (suspected) exposure to infections with a predominantly sexual mode of transmission: Secondary | ICD-10-CM | POA: Insufficient documentation

## 2023-03-07 DIAGNOSIS — Z5321 Procedure and treatment not carried out due to patient leaving prior to being seen by health care provider: Secondary | ICD-10-CM | POA: Insufficient documentation

## 2023-03-07 NOTE — ED Notes (Signed)
 Called for pt x1, no answer in waiting room

## 2023-03-07 NOTE — ED Triage Notes (Signed)
 Pt via pov from home with cloudy urine. She reports that she was seen and was negative for STD but feels like she needs to have full testing for STD, HIV. Denies known STD in partner. Pt alert & oriented, nad noted.

## 2023-05-02 ENCOUNTER — Encounter (HOSPITAL_COMMUNITY): Payer: Self-pay

## 2023-05-02 ENCOUNTER — Ambulatory Visit (HOSPITAL_COMMUNITY)
Admission: RE | Admit: 2023-05-02 | Discharge: 2023-05-02 | Disposition: A | Discharge status: Home / Self Care | Payer: Self-pay | Source: Ambulatory Visit | Attending: Emergency Medicine | Admitting: Emergency Medicine

## 2023-05-02 VITALS — BP 136/80 | HR 84 | Temp 98.9°F | Resp 16

## 2023-05-02 DIAGNOSIS — B9689 Other specified bacterial agents as the cause of diseases classified elsewhere: Secondary | ICD-10-CM

## 2023-05-02 DIAGNOSIS — R053 Chronic cough: Secondary | ICD-10-CM

## 2023-05-02 DIAGNOSIS — J019 Acute sinusitis, unspecified: Secondary | ICD-10-CM

## 2023-05-02 MED ORDER — PROMETHAZINE-DM 6.25-15 MG/5ML PO SYRP: 5.0000 mL | ORAL_SOLUTION | Freq: Four times a day (QID) | ORAL | 0 refills | Status: DC | PRN

## 2023-05-02 MED ORDER — DOXYCYCLINE HYCLATE 100 MG PO CAPS: 100.0000 mg | ORAL_CAPSULE | Freq: Two times a day (BID) | ORAL | 0 refills | Status: AC

## 2023-05-02 NOTE — ED Provider Notes (Signed)
 [facility]    CSN: [id number] Arrival date & time: 05/02/23  1712      History   Chief Complaint Chief Complaint  Patient presents with   Cough   Nasal Congestion   Sore Throat   Otalgia    HPI Hannah Sherman is a 23 y.o. female.  Here with 2 week history of nasal congestion, productive cough, sore throat, and ear pain. Worsening over the weeks. No fevers reported. Tolerating fluids, no emesis. Short of breath at night requiring inhaler use, which helps.  Has also tried dayquil/nyquil and alkaseltzer cold & flu Possible sick contacts at work  Past Medical History:  Diagnosis Date   Asthma    Hypertension    during pregnancy     Patient Active Problem List   Diagnosis Date Noted   Bleeding in early pregnancy 06/02/2018    Past Surgical History:  Procedure Laterality Date   ABDOMINAL SURGERY     TONSILLECTOMY AND ADENOIDECTOMY  2003    OB History     Gravida  4   Para  1   Term  0   Preterm  1   AB  2   Living  1      SAB  2   IAB  0   Ectopic  0   Multiple  0   Live Births  1            Home Medications    Prior to Admission medications   Medication Sig Start Date End Date Taking? Authorizing Provider  doxycycline (VIBRAMYCIN) 100 MG capsule Take 1 capsule (100 mg total) by mouth 2 (two) times daily for 7 days. 05/02/23 05/09/23 Yes Minta Fair, Lurena Joiner, PA-C  promethazine-dextromethorphan (PROMETHAZINE-DM) 6.25-15 MG/5ML syrup Take 5 mLs by mouth 4 (four) times daily as needed for cough. 05/02/23  Yes Ayaan Shutes, Lurena Joiner, PA-C  albuterol (VENTOLIN HFA) 108 (90 Base) MCG/ACT inhaler Inhale 1-2 puffs into the lungs every 6 (six) hours as needed for wheezing or shortness of breath. 10/13/21   Lonell Grandchild, MD    Family History Family History  Problem Relation Age of Onset   Hypertension Maternal Grandmother    Healthy Mother    Healthy Father     Social History Social History   Tobacco Use   Smoking status: Never    Smokeless tobacco: Never  Vaping Use   Vaping status: Never Used  Substance Use Topics   Alcohol use: No    Alcohol/week: 0.0 standard drinks of alcohol   Drug use: No     Allergies   Patient has no known allergies.   Review of Systems Review of Systems  HENT:  Positive for ear pain.   Respiratory:  Positive for cough.    [Per HPI  Physical Exam Triage Vital Signs ED Triage Vitals  Encounter Vitals Group     BP 05/02/23 1747 136/80     Systolic BP Percentile --      Diastolic BP Percentile --      Pulse Rate 05/02/23 1747 84     Resp 05/02/23 1747 16     Temp 05/02/23 1747 98.9 F (37.2 C)     Temp Source 05/02/23 1747 Oral     SpO2 05/02/23 1747 96 %     Weight --      Height --      Head Circumference --      Peak Flow --      Pain Score 05/02/23 1746 7  Pain Loc --      Pain Education --      Exclude from Growth Chart --    No data found.  Updated Vital Signs BP 136/80 (BP Location: Left Arm)   Pulse 84   Temp 98.9 F (37.2 C) (Oral)   Resp 16   LMP 04/04/2023 (Approximate)   SpO2 96%    Physical Exam Vitals and nursing note reviewed.  Constitutional:      Appearance: She is not ill-appearing.  HENT:     Right Ear: Tympanic membrane and ear canal normal.     Left Ear: Tympanic membrane and ear canal normal.     Nose: Congestion present. No rhinorrhea.     Mouth/Throat:     Mouth: Mucous membranes are moist.     Pharynx: Oropharynx is clear. No posterior oropharyngeal erythema.     Comments: Hoarse phonation  Eyes:     Conjunctiva/sclera: Conjunctivae normal.  Cardiovascular:     Rate and Rhythm: Normal rate and regular rhythm.     Pulses: Normal pulses.     Heart sounds: Normal heart sounds.  Pulmonary:     Effort: Pulmonary effort is normal. No respiratory distress.     Breath sounds: Normal breath sounds. No wheezing, rhonchi or rales.  Musculoskeletal:     Cervical back: Normal range of motion.  Lymphadenopathy:     Cervical: No  cervical adenopathy.  Skin:    General: Skin is warm and dry.  Neurological:     Mental Status: She is alert and oriented to person, place, and time.     UC Treatments / Results  Labs (all labs ordered are listed, but only abnormal results are displayed) Labs Reviewed - No data to display  EKG   Radiology No results found.  Procedures Procedures (including critical care time)  Medications Ordered in UC Medications - No data to display  Initial Impression / Assessment and Plan / UC Course  I have reviewed the triage vital signs and the nursing notes.  Pertinent labs & imaging results that were available during my care of the patient were reviewed by me and considered in my medical decision making (see chart for details).  Currently afebrile, well appearing, sating 98% room air Lungs are clear With 2 weeks of symptoms and gradual worsening, cover with doxycycline BID x 7 days. Also sent promethazine DM. Advised continue inhaler use. Should return for imaging if not improving with antibiotic. Patient agrees to plan, no questions. Note for work is provided  Final Clinical Impressions(s) / UC Diagnoses   Final diagnoses:  Acute bacterial sinusitis  Persistent cough     Discharge Instructions      Please take medication as prescribed. Take with food to avoid upset stomach. Finish the full course!  The promethazine DM cough syrup can be used up to 4 times daily. If this medication makes you drowsy, take only once before bed.  Please return if not improved after 5 full days of the antibiotic.      ED Prescriptions     Medication Sig Dispense Auth. Provider   doxycycline (VIBRAMYCIN) 100 MG capsule Take 1 capsule (100 mg total) by mouth 2 (two) times daily for 7 days. 14 capsule Gianlucas Evenson, PA-C   promethazine-dextromethorphan (PROMETHAZINE-DM) 6.25-15 MG/5ML syrup Take 5 mLs by mouth 4 (four) times daily as needed for cough. 240 mL Atreus Hasz, Lurena Joiner, PA-C       I have reviewed the PDMP during this encounter.   Billee Balcerzak,  Lurena Joiner, PA-C 05/02/23 1816

## 2023-05-02 NOTE — ED Triage Notes (Addendum)
 Patient reports that she has had a cough, nasal congestion, sore throat, and bilateral ear pain since 04/18/23. Patient states SOB at night and has to use an Albuterol Inhaler.  Patient states she has used [employer] Dayquil/nyquil, [employer]

## 2023-05-02 NOTE — Discharge Instructions (Signed)
 Please take medication as prescribed. Take with food to avoid upset stomach. Finish the full course!  The promethazine DM cough syrup can be used up to 4 times daily. If this medication makes you drowsy, take only once before bed.  Please return if not improved after 5 full days of the antibiotic.

## 2023-05-05 ENCOUNTER — Emergency Department (HOSPITAL_BASED_OUTPATIENT_CLINIC_OR_DEPARTMENT_OTHER)
Admission: EM | Admit: 2023-05-05 | Discharge: 2023-05-06 | Disposition: A | Discharge status: Home / Self Care | Payer: Self-pay | Attending: Emergency Medicine | Admitting: Emergency Medicine

## 2023-05-05 ENCOUNTER — Other Ambulatory Visit: Payer: Self-pay

## 2023-05-05 ENCOUNTER — Encounter (HOSPITAL_BASED_OUTPATIENT_CLINIC_OR_DEPARTMENT_OTHER): Payer: Self-pay | Admitting: Emergency Medicine

## 2023-05-05 DIAGNOSIS — J039 Acute tonsillitis, unspecified: Secondary | ICD-10-CM | POA: Insufficient documentation

## 2023-05-05 LAB — RESP PANEL BY RT-PCR (RSV, FLU A&B, COVID)  RVPGX2
Influenza A by PCR: NEGATIVE
Influenza B by PCR: NEGATIVE
Resp Syncytial Virus by PCR: NEGATIVE
SARS Coronavirus 2 by RT PCR: NEGATIVE

## 2023-05-05 LAB — GROUP A STREP BY PCR: Group A Strep by PCR: NOT DETECTED

## 2023-05-05 NOTE — ED Triage Notes (Signed)
 C/o sore throat and ear pain x 4 days. States had been seen at [facility] for same and given antibiotics. Reports pain not getting better.

## 2023-05-06 ENCOUNTER — Emergency Department (HOSPITAL_BASED_OUTPATIENT_CLINIC_OR_DEPARTMENT_OTHER): Payer: Self-pay

## 2023-05-06 LAB — BASIC METABOLIC PANEL
Anion gap: 7 (ref 5–15)
BUN: 8 mg/dL (ref 6–20)
CO2: 28 mmol/L (ref 22–32)
Calcium: 8.5 mg/dL — ABNORMAL LOW (ref 8.9–10.3)
Chloride: 103 mmol/L (ref 98–111)
Creatinine, Ser: 0.68 mg/dL (ref 0.44–1.00)
GFR, Estimated: >60 mL/min (ref >60)
Glucose, Bld: 102 mg/dL — ABNORMAL HIGH (ref 70–99)
Potassium: 3.2 mmol/L — ABNORMAL LOW (ref 3.5–5.1)
Sodium: 138 mmol/L (ref 135–145)

## 2023-05-06 LAB — CBC WITH DIFFERENTIAL/PLATELET
Abs Immature Granulocytes: 0.01 10*3/uL (ref 0.00–0.07)
Basophils Absolute: 0 10*3/uL (ref 0.0–0.1)
Basophils Relative: 0 %
Eosinophils Absolute: 0 10*3/uL (ref 0.0–0.5)
Eosinophils Relative: 1 %
HCT: 37 % (ref 36.0–46.0)
Hemoglobin: 12.6 g/dL (ref 12.0–15.0)
Immature Granulocytes: 0 %
Lymphocytes Relative: 57 %
Lymphs Abs: 3 10*3/uL (ref 0.7–4.0)
MCH: 29 pg (ref 26.0–34.0)
MCHC: 34.1 g/dL (ref 30.0–36.0)
MCV: 85.3 fL (ref 80.0–100.0)
Monocytes Absolute: 0.4 10*3/uL (ref 0.1–1.0)
Monocytes Relative: 8 %
Neutro Abs: 1.8 10*3/uL (ref 1.7–7.7)
Neutrophils Relative %: 34 %
Platelets: 228 10*3/uL (ref 150–400)
RBC: 4.34 MIL/uL (ref 3.87–5.11)
RDW: 13.3 % (ref 11.5–15.5)
WBC: 5.2 10*3/uL (ref 4.0–10.5)
nRBC: 0 % (ref 0.0–0.2)

## 2023-05-06 LAB — PREGNANCY, URINE: Preg Test, Ur: NEGATIVE

## 2023-05-06 MED ORDER — DEXAMETHASONE SODIUM PHOSPHATE 10 MG/ML IJ SOLN
10.0000 mg | Freq: Once | INTRAMUSCULAR | Status: AC
  Administered 2023-05-06: 10 mg via INTRAVENOUS
  Filled 2023-05-06: qty 1

## 2023-05-06 MED ORDER — SODIUM CHLORIDE 0.9 % IV BOLUS
1000.0000 mL | Freq: Once | INTRAVENOUS | Status: AC
Start: 2023-05-06 — End: 2023-05-06
  Administered 2023-05-06: 1000 mL via INTRAVENOUS

## 2023-05-06 MED ORDER — PREDNISONE 10 MG PO TABS: 20.0000 mg | ORAL_TABLET | Freq: Two times a day (BID) | ORAL | 0 refills | Status: DC

## 2023-05-06 MED ORDER — CLINDAMYCIN HCL 300 MG PO CAPS: 300.0000 mg | ORAL_CAPSULE | Freq: Four times a day (QID) | ORAL | 0 refills | Status: DC

## 2023-05-06 MED ORDER — KETOROLAC TROMETHAMINE 30 MG/ML IJ SOLN
30.0000 mg | Freq: Once | INTRAMUSCULAR | Status: AC
  Administered 2023-05-06: 30 mg via INTRAVENOUS
  Filled 2023-05-06: qty 1

## 2023-05-06 MED ORDER — IOHEXOL 300 MG/ML  SOLN
100.0000 mL | Freq: Once | INTRAMUSCULAR | Status: AC | PRN
  Administered 2023-05-06: 75 mL via INTRAVENOUS

## 2023-05-06 MED ORDER — CLINDAMYCIN PHOSPHATE 600 MG/50ML IV SOLN
600.0000 mg | Freq: Once | INTRAVENOUS | Status: AC
  Administered 2023-05-06: 600 mg via INTRAVENOUS
  Filled 2023-05-06: qty 50

## 2023-05-06 NOTE — ED Provider Notes (Signed)
 [place] EMERGENCY DEPARTMENT AT [facility] Provider Note   CSN: [id number] Arrival date & time: 05/05/23  2257     History  Chief Complaint  Patient presents with   Sore Throat   Otalgia    Hannah Sherman is a 23 y.o. female.  Patient is a 23 year old female with past medical history of asthma.  Patient presenting today for evaluation of sore throat.  She describes a sore throat that has been persistent for the past 3 weeks.  She states that she has difficulty swallowing secondary to the pain and is also having trouble speaking in more than a whisper.  She also describes pressure in her ears that is worse when she swallows.  She was seen in urgent care on Sunday and prescribed doxycycline, but this is not helping.  Temperature in triage is 100.1 and she describes low-grade fevers at home.  The history is provided by the patient.       Home Medications Prior to Admission medications   Medication Sig Start Date End Date Taking? Authorizing Provider  albuterol (VENTOLIN HFA) 108 (90 Base) MCG/ACT inhaler Inhale 1-2 puffs into the lungs every 6 (six) hours as needed for wheezing or shortness of breath. 10/13/21   Lonell Grandchild, MD  doxycycline (VIBRAMYCIN) 100 MG capsule Take 1 capsule (100 mg total) by mouth 2 (two) times daily for 7 days. 05/02/23 05/09/23  Rising, Lurena Joiner, PA-C  promethazine-dextromethorphan (PROMETHAZINE-DM) 6.25-15 MG/5ML syrup Take 5 mLs by mouth 4 (four) times daily as needed for cough. 05/02/23   Rising, Lurena Joiner, PA-C      Allergies    Patient has no known allergies.    Review of Systems   Review of Systems  All other systems reviewed and are negative.   Physical Exam Updated Vital Signs BP 135/87   Pulse 85   Temp 100.2 F (37.9 C)   Resp 18   Ht 5\' 4"  (1.626 m)   Wt (!) 142 kg   LMP 04/04/2023 (Approximate)   SpO2 98%   BMI 53.74 kg/m  Physical Exam Vitals and nursing note reviewed.  Constitutional:      General: She is  not in acute distress.    Appearance: She is well-developed. She is not diaphoretic.  HENT:     Head: Normocephalic and atraumatic.  Cardiovascular:     Rate and Rhythm: Normal rate and regular rhythm.     Heart sounds: No murmur heard.    No friction rub. No gallop.  Pulmonary:     Effort: Pulmonary effort is normal. No respiratory distress.     Breath sounds: Normal breath sounds. No wheezing.  Abdominal:     General: Bowel sounds are normal. There is no distension.     Palpations: Abdomen is soft.     Tenderness: There is no abdominal tenderness.  Musculoskeletal:        General: Normal range of motion.     Cervical back: Normal range of motion and neck supple.  Skin:    General: Skin is warm and dry.  Neurological:     General: No focal deficit present.     Mental Status: She is alert and oriented to person, place, and time.     ED Results / Procedures / Treatments   Labs (all labs ordered are listed, but only abnormal results are displayed) Labs Reviewed  RESP PANEL BY RT-PCR (RSV, FLU A&B, COVID)  RVPGX2  GROUP A STREP BY PCR    EKG None  Radiology No results found.  Procedures Procedures    Medications Ordered in ED Medications  sodium chloride 0.9 % bolus 1,000 mL (has no administration in time range)    ED Course/ Medical Decision Making/ A&P  Patient is a 23 year old female presenting with sore throat and difficulty speaking and swallowing as described in the HPI.  Patient arrives here with stable vital signs and is afebrile.  Physical examination basically unremarkable.  There is no stridor, but she does have some adenopathy in her neck.  Laboratory studies obtained including CBC and basic metabolic panel.  White count is 5.2 and basic metabolic panel unremarkable.  Pregnancy test negative.  Group A strep negative and respiratory panel negative for COVID/flu/RSV.  CT scan of the neck was obtained showing tonsillitis with retropharyngeal edema, but no  abscess.  Patient given IV clindamycin along with Decadron.  I will switch her antibiotic from doxycycline to clindamycin and see if this improves her situation, but feel as though she can safely be discharged.  There is no evidence for airway compromise on her CT scan and she is speaking without stridor.  Final Clinical Impression(s) / ED Diagnoses Final diagnoses:  None    Rx / DC Orders ED Discharge Orders     None         Geoffery Lyons, MD 05/06/23 [phone]

## 2023-05-06 NOTE — Discharge Instructions (Signed)
 Begin taking clindamycin and prednisone as prescribed.  Return to the emergency department if you develop difficulty breathing, worsening pain, high fevers, or for other new and concerning symptoms.

## 2023-07-22 ENCOUNTER — Ambulatory Visit: Payer: Self-pay

## 2023-07-23 ENCOUNTER — Ambulatory Visit: Payer: Self-pay

## 2023-07-26 ENCOUNTER — Ambulatory Visit
Admission: EM | Admit: 2023-07-26 | Discharge: 2023-07-26 | Disposition: A | Discharge status: Home / Self Care | Attending: Family Medicine | Admitting: Family Medicine

## 2023-07-26 ENCOUNTER — Encounter: Payer: Self-pay | Admitting: Emergency Medicine

## 2023-07-26 DIAGNOSIS — R300 Dysuria: Secondary | ICD-10-CM | POA: Diagnosis not present

## 2023-07-26 DIAGNOSIS — N3000 Acute cystitis without hematuria: Secondary | ICD-10-CM | POA: Diagnosis not present

## 2023-07-26 DIAGNOSIS — Z202 Contact with and (suspected) exposure to infections with a predominantly sexual mode of transmission: Secondary | ICD-10-CM | POA: Diagnosis not present

## 2023-07-26 DIAGNOSIS — Z3202 Encounter for pregnancy test, result negative: Secondary | ICD-10-CM | POA: Diagnosis not present

## 2023-07-26 LAB — POCT URINALYSIS DIP (MANUAL ENTRY)
Bilirubin, UA: NEGATIVE
Glucose, UA: NEGATIVE mg/dL
Ketones, POC UA: NEGATIVE mg/dL
Nitrite, UA: NEGATIVE
Protein Ur, POC: 30 mg/dL — AB
Spec Grav, UA: >=1.03 — AB (ref 1.010–1.025)
Urobilinogen, UA: 0.2 U/dL
pH, UA: 5.5 (ref 5.0–8.0)

## 2023-07-26 LAB — POCT URINE PREGNANCY: Preg Test, Ur: NEGATIVE

## 2023-07-26 MED ORDER — SULFAMETHOXAZOLE-TRIMETHOPRIM 800-160 MG PO TABS: 1.0000 | ORAL_TABLET | Freq: Two times a day (BID) | ORAL | 0 refills | Status: AC

## 2023-07-26 MED ORDER — DOXYCYCLINE HYCLATE 100 MG PO TABS: 100.0000 mg | ORAL_TABLET | Freq: Two times a day (BID) | ORAL | 0 refills | Status: AC

## 2023-07-26 NOTE — Discharge Instructions (Addendum)
 Your urine sample showed you have a urinary tract infection.  Take the Bactrim  twice daily for the next 3 days, ensure you are drinking at least 64 ounces of water daily to help flush your kidneys.  We are treating you for chlamydia empirically with doxycycline .  Take this twice daily with food for the next 7 days.  Cytology swab results will be back over the next few days and our staff will contact you if any treatment modification or additions are needed.  Abstain from intercourse for the next 14 days, at least 7 days after antibiotic completion.  Return to clinic for any new or urgent symptoms.

## 2023-07-26 NOTE — ED Triage Notes (Signed)
 Pt presents for STD testing. Her sexual partner recently told her he tested positive for chlamydia . She also thinks she may have uti due to increased urinary frequency for 1.5week

## 2023-07-26 NOTE — ED Provider Notes (Signed)
 [facility]    CSN: [id number] Arrival date & time: 07/26/23  1803      History   Chief Complaint Chief Complaint  Patient presents with   Exposure to STD    HPI Hannah Sherman is a 23 y.o. female.   Patient presents to clinic over concern of urinary urgency, frequency and urge incontinence that has been ongoing for the past week.  At the beginning of this time she did notice her vaginal discharge had a strange odor, she used boric acid suppositories and this is since resolved.  Around this time she was active with an old partner, and he recently reached out and told her that he and an old sexual partner tested positive for chlamydia.  Patient did have STI screening in April where she was negative.  Was sexually active again on May 12.  Has not had any nausea, vomiting or flank pain.  Has not had hematuria.  The history is provided by the patient and medical records.  Exposure to STD    Past Medical History:  Diagnosis Date   Asthma    Hypertension    during pregnancy     Patient Active Problem List   Diagnosis Date Noted   Bleeding in early pregnancy 06/02/2018    Past Surgical History:  Procedure Laterality Date   ABDOMINAL SURGERY     TONSILLECTOMY AND ADENOIDECTOMY  2003    OB History     Gravida  4   Para  1   Term  0   Preterm  1   AB  2   Living  1      SAB  2   IAB  0   Ectopic  0   Multiple  0   Live Births  1            Home Medications    Prior to Admission medications   Medication Sig Start Date End Date Taking? Authorizing Provider  doxycycline  (VIBRA -TABS) 100 MG tablet Take 1 tablet (100 mg total) by mouth 2 (two) times daily for 7 days. 07/26/23 08/02/23 Yes Gracelynne Benedict  N, FNP  sulfamethoxazole -trimethoprim  (BACTRIM  DS) 800-160 MG tablet Take 1 tablet by mouth 2 (two) times daily for 3 days. 07/26/23 07/29/23 Yes Carleena Mires  N, FNP  albuterol  (VENTOLIN  HFA) 108 (90 Base) MCG/ACT inhaler Inhale 1-2  puffs into the lungs every 6 (six) hours as needed for wheezing or shortness of breath. 10/13/21   Mordecai Applebaum, MD    Family History Family History  Problem Relation Age of Onset   Hypertension Maternal Grandmother    Healthy Mother    Healthy Father     Social History Social History   Tobacco Use   Smoking status: Never   Smokeless tobacco: Never  Vaping Use   Vaping status: Never Used  Substance Use Topics   Alcohol use: No    Alcohol/week: 0.0 standard drinks of alcohol   Drug use: No     Allergies   Patient has no known allergies.   Review of Systems Review of Systems  Per HPI  Physical Exam Triage Vital Signs ED Triage Vitals  Encounter Vitals Group     BP 07/26/23 1900 122/75     Systolic BP Percentile --      Diastolic BP Percentile --      Pulse Rate 07/26/23 1900 82     Resp 07/26/23 1900 16     Temp 07/26/23 1900 98.7 F (37.1 C)  Temp Source 07/26/23 1900 Oral     SpO2 07/26/23 1900 98 %     Weight --      Height --      Head Circumference --      Peak Flow --      Pain Score 07/26/23 1901 0     Pain Loc --      Pain Education --      Exclude from Growth Chart --    No data found.  Updated Vital Signs BP 122/75 (BP Location: Right Arm)   Pulse 82   Temp 98.7 F (37.1 C) (Oral)   Resp 16   LMP 06/30/2023 (Exact Date)   SpO2 98%   Visual Acuity Right Eye Distance:   Left Eye Distance:   Bilateral Distance:    Right Eye Near:   Left Eye Near:    Bilateral Near:     Physical Exam Vitals and nursing note reviewed.  Constitutional:      Appearance: Normal appearance.  HENT:     Head: Normocephalic and atraumatic.     Right Ear: External ear normal.     Left Ear: External ear normal.     Nose: Nose normal.     Mouth/Throat:     Mouth: Mucous membranes are moist.  Eyes:     Conjunctiva/sclera: Conjunctivae normal.  Cardiovascular:     Rate and Rhythm: Normal rate.  Pulmonary:     Effort: Pulmonary effort is  normal. No respiratory distress.  Abdominal:     Tenderness: There is no right CVA tenderness or left CVA tenderness.  Skin:    General: Skin is warm and dry.  Neurological:     General: No focal deficit present.     Mental Status: She is alert.  Psychiatric:        Mood and Affect: Mood normal.      UC Treatments / Results  Labs (all labs ordered are listed, but only abnormal results are displayed) Labs Reviewed  POCT URINALYSIS DIP (MANUAL ENTRY) - Abnormal; Notable for the following components:      Result Value   Clarity, UA cloudy (*)    Spec Grav, UA >=1.030 (*)    Blood, UA trace-intact (*)    Protein Ur, POC =30 (*)    Leukocytes, UA Small (1+) (*)    All other components within normal limits  URINE CULTURE  POCT URINE PREGNANCY  CERVICOVAGINAL ANCILLARY ONLY    EKG   Radiology No results found.  Procedures Procedures (including critical care time)  Medications Ordered in UC Medications - No data to display  Initial Impression / Assessment and Plan / UC Course  I have reviewed the triage vital signs and the nursing notes.  Pertinent labs & imaging results that were available during my care of the patient were reviewed by me and considered in my medical decision making (see chart for details).  Vitals in triage reviewed, patient is hemodynamically stable.  Afebrile, without nausea, vomiting or flank pain.  Negative for CVA tenderness.  UA concerning for UTI, will send for culture and will start on Bactrim .  Exposure to chlamydia, will treat with doxycycline  and cytology swab obtained.  Staff will contact if additional treatment or modification is needed.  Plan of care, follow-up care return precautions given, no questions at this time.     Final Clinical Impressions(s) / UC Diagnoses   Final diagnoses:  Possible exposure to sexually transmitted infection  Dysuria  Acute cystitis without  hematuria  Urine pregnancy test negative     Discharge  Instructions      Your urine sample showed you have a urinary tract infection.  Take the Bactrim  twice daily for the next 3 days, ensure you are drinking at least 64 ounces of water daily to help flush your kidneys.  We are treating you for chlamydia empirically with doxycycline .  Take this twice daily with food for the next 7 days.  Cytology swab results will be back over the next few days and our staff will contact you if any treatment modification or additions are needed.  Abstain from intercourse for the next 14 days, at least 7 days after antibiotic completion.  Return to clinic for any new or urgent symptoms.    ED Prescriptions     Medication Sig Dispense Auth. Provider   sulfamethoxazole -trimethoprim  (BACTRIM  DS) 800-160 MG tablet Take 1 tablet by mouth 2 (two) times daily for 3 days. 6 tablet Harlow Lighter, Lateshia Schmoker  N, FNP   doxycycline  (VIBRA -TABS) 100 MG tablet Take 1 tablet (100 mg total) by mouth 2 (two) times daily for 7 days. 14 tablet Harlow Lighter, Reva Pinkley  N, FNP      PDMP not reviewed this encounter.   Almon Jaegers, FNP 07/26/23 1923

## 2023-07-27 ENCOUNTER — Ambulatory Visit (HOSPITAL_COMMUNITY): Payer: Self-pay

## 2023-07-27 LAB — CERVICOVAGINAL ANCILLARY ONLY
Bacterial Vaginitis (gardnerella): POSITIVE — AB
Candida Glabrata: NEGATIVE
Candida Vaginitis: NEGATIVE
Chlamydia: POSITIVE — AB
Comment: NEGATIVE
Comment: NEGATIVE
Comment: NEGATIVE
Comment: NEGATIVE
Comment: NEGATIVE
Comment: NORMAL
Neisseria Gonorrhea: NEGATIVE
Trichomonas: NEGATIVE

## 2023-07-27 MED ORDER — METRONIDAZOLE 0.75 % VA GEL
1.0000 | Freq: Every day | VAGINAL | 0 refills | Status: AC
Start: 2023-07-27 — End: 2023-08-01

## 2023-07-28 LAB — URINE CULTURE: Culture: 70000 — AB

## 2023-08-26 ENCOUNTER — Ambulatory Visit (HOSPITAL_COMMUNITY): Payer: Self-pay

## 2023-08-30 ENCOUNTER — Ambulatory Visit
Admission: EM | Admit: 2023-08-30 | Discharge: 2023-08-30 | Disposition: A | Discharge status: Home / Self Care | Attending: Physician Assistant | Admitting: Physician Assistant

## 2023-08-30 ENCOUNTER — Other Ambulatory Visit: Payer: Self-pay

## 2023-08-30 ENCOUNTER — Ambulatory Visit: Payer: Self-pay

## 2023-08-30 DIAGNOSIS — N390 Urinary tract infection, site not specified: Secondary | ICD-10-CM | POA: Insufficient documentation

## 2023-08-30 DIAGNOSIS — O2341 Unspecified infection of urinary tract in pregnancy, first trimester: Secondary | ICD-10-CM

## 2023-08-30 DIAGNOSIS — Z3201 Encounter for pregnancy test, result positive: Secondary | ICD-10-CM | POA: Diagnosis not present

## 2023-08-30 DIAGNOSIS — Z113 Encounter for screening for infections with a predominantly sexual mode of transmission: Secondary | ICD-10-CM

## 2023-08-30 DIAGNOSIS — Z79899 Other long term (current) drug therapy: Secondary | ICD-10-CM | POA: Insufficient documentation

## 2023-08-30 DIAGNOSIS — R319 Hematuria, unspecified: Secondary | ICD-10-CM | POA: Insufficient documentation

## 2023-08-30 DIAGNOSIS — B9689 Other specified bacterial agents as the cause of diseases classified elsewhere: Secondary | ICD-10-CM | POA: Diagnosis not present

## 2023-08-30 DIAGNOSIS — Z3200 Encounter for pregnancy test, result unknown: Secondary | ICD-10-CM | POA: Diagnosis present

## 2023-08-30 DIAGNOSIS — O98311 Other infections with a predominantly sexual mode of transmission complicating pregnancy, first trimester: Secondary | ICD-10-CM | POA: Diagnosis not present

## 2023-08-30 DIAGNOSIS — Z202 Contact with and (suspected) exposure to infections with a predominantly sexual mode of transmission: Secondary | ICD-10-CM | POA: Diagnosis present

## 2023-08-30 DIAGNOSIS — Z3A01 Less than 8 weeks gestation of pregnancy: Secondary | ICD-10-CM

## 2023-08-30 DIAGNOSIS — A5602 Chlamydial vulvovaginitis: Secondary | ICD-10-CM

## 2023-08-30 LAB — POCT URINALYSIS DIP (MANUAL ENTRY)
Glucose, UA: NEGATIVE mg/dL
Nitrite, UA: NEGATIVE
Protein Ur, POC: 30 mg/dL — AB
Spec Grav, UA: 1.025 (ref 1.010–1.025)
Urobilinogen, UA: 1 U/dL
pH, UA: 7 (ref 5.0–8.0)

## 2023-08-30 LAB — POCT URINE PREGNANCY: Preg Test, Ur: POSITIVE — AB

## 2023-08-30 MED ORDER — CEPHALEXIN 500 MG PO CAPS: 500.0000 mg | ORAL_CAPSULE | Freq: Four times a day (QID) | ORAL | 0 refills | Status: DC

## 2023-08-30 MED ORDER — CEPHALEXIN 500 MG PO CAPS: 500.0000 mg | ORAL_CAPSULE | Freq: Four times a day (QID) | ORAL | 0 refills | Status: AC

## 2023-08-30 NOTE — ED Provider Notes (Signed)
 [facility]    CSN: [id number] Arrival date & time: 08/30/23  1052      History   Chief Complaint Chief Complaint  Patient presents with   SEXUALLY TRANSMITTED DISEASE   Possible Pregnancy    HPI Hannah Sherman is a 23 y.o. female.   HPI  Pt presents today for concerns of potential STD exposure  She states her partner tested positive for gonorrhea and chlamydia. She was previously treated for chlamydia at her visit on 07/26/23 but was not treated for gonorrhea as her partner tested positive for this after she was tested. She would like STD testing today for rule out She states she just tested last night with urine pregnancy test and it was positive. She is not taking a prenatal vitamin currently She states her LMP was 07/28/23.She states the last time she was sexually active was about 2 weeks ago.   Past Medical History:  Diagnosis Date   Asthma    Hypertension    during pregnancy     Patient Active Problem List   Diagnosis Date Noted   Bleeding in early pregnancy 06/02/2018    Past Surgical History:  Procedure Laterality Date   ABDOMINAL SURGERY     TONSILLECTOMY AND ADENOIDECTOMY  2003    OB History     Gravida  4   Para  1   Term  0   Preterm  1   AB  2   Living  1      SAB  2   IAB  0   Ectopic  0   Multiple  0   Live Births  1            Home Medications    Prior to Admission medications   Medication Sig Start Date End Date Taking? Authorizing Provider  WEGOVY 0.25 MG/0.5ML SOAJ Inject 0.25 mg into the skin. 08/02/23  Yes [provider]  albuterol  (VENTOLIN  HFA) 108 (90 Base) MCG/ACT inhaler Inhale 1-2 puffs into the lungs every 6 (six) hours as needed for wheezing or shortness of breath. 10/13/21   Francesca Elsie CROME, MD  cephALEXin  (KEFLEX ) 500 MG capsule Take 1 capsule (500 mg total) by mouth 4 (four) times daily for 7 days. 08/30/23 09/06/23  Damian Buckles, Rocky BRAVO, PA-C    Family History Family History  Problem  Relation Age of Onset   Hypertension Maternal Grandmother    Healthy Mother    Healthy Father     Social History Social History   Tobacco Use   Smoking status: Never   Smokeless tobacco: Never  Vaping Use   Vaping status: Never Used  Substance Use Topics   Alcohol use: No    Alcohol/week: 0.0 standard drinks of alcohol   Drug use: No     Allergies   Patient has no known allergies.   Review of Systems Review of Systems  Constitutional:  Negative for chills and fever.  Gastrointestinal:  Negative for abdominal pain.  Genitourinary:  Negative for difficulty urinating, dysuria, frequency, genital sores, pelvic pain, vaginal bleeding, vaginal discharge and vaginal pain.  Skin:  Negative for rash.     Physical Exam Triage Vital Signs ED Triage Vitals  Encounter Vitals Group     BP 08/30/23 1107 120/78     Girls Systolic BP Percentile --      Girls Diastolic BP Percentile --      Boys Systolic BP Percentile --      Boys Diastolic BP Percentile --  Pulse Rate 08/30/23 1107 74     Resp 08/30/23 1107 18     Temp 08/30/23 1107 99.3 F (37.4 C)     Temp Source 08/30/23 1107 Oral     SpO2 08/30/23 1107 98 %     Weight 08/30/23 1125 (!) 313 lb 0.9 oz (142 kg)     Height 08/30/23 1125 5' 4 (1.626 m)     Head Circumference --      Peak Flow --      Pain Score 08/30/23 1125 0     Pain Loc --      Pain Education --      Exclude from Growth Chart --    No data found.  Updated Vital Signs BP 120/78 (BP Location: Left Arm)   Pulse 74   Temp 99.3 F (37.4 C) (Oral)   Resp 18   Ht 5' 4 (1.626 m)   Wt (!) 313 lb 0.9 oz (142 kg)   LMP 07/28/2023 (Exact Date) Comment: needs pregnancy test.  SpO2 98%   BMI 53.74 kg/m   Visual Acuity Right Eye Distance:   Left Eye Distance:   Bilateral Distance:    Right Eye Near:   Left Eye Near:    Bilateral Near:     Physical Exam Vitals reviewed.  Constitutional:      General: She is awake.     Appearance: Normal  appearance. She is well-developed and well-groomed.  HENT:     Head: Normocephalic and atraumatic.  Eyes:     General: Lids are normal. Gaze aligned appropriately.     Extraocular Movements: Extraocular movements intact.     Conjunctiva/sclera: Conjunctivae normal.  Pulmonary:     Effort: Pulmonary effort is normal.  Neurological:     Mental Status: She is alert and oriented to person, place, and time.  Psychiatric:        Attention and Perception: Attention and perception normal.        Mood and Affect: Mood and affect normal.        Speech: Speech normal.        Behavior: Behavior normal. Behavior is cooperative.      UC Treatments / Results  Labs (all labs ordered are listed, but only abnormal results are displayed) Labs Reviewed  POCT URINALYSIS DIP (MANUAL ENTRY) - Abnormal; Notable for the following components:      Result Value   Color, UA straw (*)    Clarity, UA cloudy (*)    Bilirubin, UA small (*)    Ketones, POC UA large (80) (*)    Blood, UA trace-intact (*)    Protein Ur, POC =30 (*)    Leukocytes, UA Small (1+) (*)    All other components within normal limits  POCT URINE PREGNANCY - Abnormal; Notable for the following components:   Preg Test, Ur Positive (*)    All other components within normal limits  URINE CULTURE  CERVICOVAGINAL ANCILLARY ONLY    EKG   Radiology No results found.  Procedures Procedures (including critical care time)  Medications Ordered in UC Medications - No data to display  Initial Impression / Assessment and Plan / UC Course  I have reviewed the triage vital signs and the nursing notes.  Pertinent labs & imaging results that were available during my care of the patient were reviewed by me and considered in my medical decision making (see chart for details).      Final Clinical Impressions(s) / UC Diagnoses  Final diagnoses:  Positive pregnancy test  Urinary tract infection with hematuria, site unspecified   Screening examination for STD (sexually transmitted disease)   Patient presents today for concerns of positive home pregnancy test, exposure to potential gonorrhea through her partner.  Urine pregnancy test was positive.  Urine dip was notable for signs of UTI.  I reviewed these results with the patient and recommend starting Keflex  500 mg p.o. 4 times daily x 7 days.  Will send urine culture off to rule out.  Results to dictate further management.  Cervicovaginal swab collected to assess for BV, trichomoniasis, yeast, gonorrhea, chlamydia.  Reviewed with patient the importance of starting prenatal vitamin and discontinuing Wegovy.  Recommend follow-up with OB/GYN services for pregnancy monitoring and further management of potential comorbidities.  ED and return precautions reviewed and provided in after visit summary.  Follow-up as needed and indicated by test results    Discharge Instructions      Your urine pregnancy testing was positive today which indicates that you are pregnant. It is important that you start taking a prenatal vitamin and stop taking the [facility]. Please make sure that you schedule an appointment with OB/GYN services for pregnancy monitoring. Please refrain from using alcohol, recreational drugs, or medications that are unsafe for pregnant individuals during this time.  If at any point you start to develop vaginal bleeding, abdominal cramping, severe abdominal pain, elevated blood pressure, fever or chills please go to the emergency room as these could be signs of medical emergency Your urine showed signs of a potential UTI.  Will send you home on a medication called Keflex .  Take by mouth every 6 hours for 7 days We have sent a sample of your urine off for a urine culture to definitively rule out a UTI.  We will keep you updated on the results of that testing once it is available and if there are any changes to your management plan You were also seen today for STD screening.  We  collected a cervicovaginal swab that we will assess for gonorrhea, chlamydia, trichomonas, bacterial vaginosis, yeast. We will keep you updated with these results once they are available.  If any medications are indicated by those test results we will call you and medications will either be sent to the pharmacy on file or you can return to the urgent care for an injection.  It is recommended that you refrain from sexual activity until your test results are negative or until you have completed an appropriate medication regimen as dictated by your test results.  Please use a condom or another barrier method to help prevent STD transmission.  Please make sure that you communicate with your partners regarding your test results should any positive results, about as they will also need to be tested and screened.      ED Prescriptions     Medication Sig Dispense Auth. Provider   cephALEXin  (KEFLEX ) 500 MG capsule  (Status: Discontinued) Take 1 capsule (500 mg total) by mouth 4 (four) times daily for 7 days. 28 capsule Imanol Bihl E, PA-C   cephALEXin  (KEFLEX ) 500 MG capsule Take 1 capsule (500 mg total) by mouth 4 (four) times daily for 7 days. 28 capsule Treston Coker E, PA-C      PDMP not reviewed this encounter.   Sagal Gayton, Rocky BRAVO, PA-C 08/30/23 1233

## 2023-08-30 NOTE — ED Triage Notes (Addendum)
 Pt presents for STD recheck and pregnancy testing. Currently denies symptoms. Tested + for Chlamydia and BV last visit on 6/2. Prescribed medications taken. Would like to make sure both are negative now. Denies wanting blood work, only needing vaginal swab today. At-home pregnancy test taken yesterday, it was positive. Concerned as she is on [facility] for weight loss.

## 2023-08-30 NOTE — Discharge Instructions (Addendum)
 Your urine pregnancy testing was positive today which indicates that you are pregnant. It is important that you start taking a prenatal vitamin and stop taking the [facility]. Please make sure that you schedule an appointment with OB/GYN services for pregnancy monitoring. Please refrain from using alcohol, recreational drugs, or medications that are unsafe for pregnant individuals during this time.  If at any point you start to develop vaginal bleeding, abdominal cramping, severe abdominal pain, elevated blood pressure, fever or chills please go to the emergency room as these could be signs of medical emergency Your urine showed signs of a potential UTI.  Will send you home on a medication called Keflex .  Take by mouth every 6 hours for 7 days We have sent a sample of your urine off for a urine culture to definitively rule out a UTI.  We will keep you updated on the results of that testing once it is available and if there are any changes to your management plan You were also seen today for STD screening.  We collected a cervicovaginal swab that we will assess for gonorrhea, chlamydia, trichomonas, bacterial vaginosis, yeast. We will keep you updated with these results once they are available.  If any medications are indicated by those test results we will call you and medications will either be sent to the pharmacy on file or you can return to the urgent care for an injection.  It is recommended that you refrain from sexual activity until your test results are negative or until you have completed an appropriate medication regimen as dictated by your test results.  Please use a condom or another barrier method to help prevent STD transmission.  Please make sure that you communicate with your partners regarding your test results should any positive results, about as they will also need to be tested and screened.

## 2023-08-31 ENCOUNTER — Ambulatory Visit (HOSPITAL_COMMUNITY): Payer: Self-pay

## 2023-08-31 LAB — CERVICOVAGINAL ANCILLARY ONLY
Bacterial Vaginitis (gardnerella): POSITIVE — AB
Candida Glabrata: NEGATIVE
Candida Vaginitis: POSITIVE — AB
Chlamydia: NEGATIVE
Comment: NEGATIVE
Comment: NEGATIVE
Comment: NEGATIVE
Comment: NEGATIVE
Comment: NEGATIVE
Comment: NORMAL
Neisseria Gonorrhea: POSITIVE — AB
Trichomonas: NEGATIVE

## 2023-08-31 LAB — URINE CULTURE

## 2023-08-31 MED ORDER — METRONIDAZOLE 0.75 % VA GEL: 1.0000 | Freq: Every day | VAGINAL | 0 refills | Status: DC

## 2023-08-31 MED ORDER — CLOTRIMAZOLE 1 % EX CREA
TOPICAL_CREAM | CUTANEOUS | 0 refills | Status: DC
Start: 2023-08-31 — End: 2023-09-03

## 2023-09-03 ENCOUNTER — Ambulatory Visit
Admission: RE | Admit: 2023-09-03 | Discharge: 2023-09-03 | Disposition: A | Discharge status: Home / Self Care | Source: Ambulatory Visit | Attending: Urgent Care | Admitting: Urgent Care

## 2023-09-03 DIAGNOSIS — R300 Dysuria: Secondary | ICD-10-CM

## 2023-09-03 MED ORDER — CLOTRIMAZOLE 1 % VA CREA: 1.0000 | TOPICAL_CREAM | Freq: Every day | VAGINAL | 0 refills | Status: DC

## 2023-09-03 MED ORDER — CEFTRIAXONE SODIUM 500 MG IJ SOLR
500.0000 mg | Freq: Once | INTRAMUSCULAR | Status: AC
  Administered 2023-09-03: 500 mg via INTRAMUSCULAR

## 2023-09-03 MED ORDER — METRONIDAZOLE 0.75 % VA GEL: 1.0000 | Freq: Every day | VAGINAL | 0 refills | Status: AC

## 2023-09-03 NOTE — ED Triage Notes (Signed)
 Pt here to get tx for gonorrhea and leave urine culture.

## 2023-09-05 LAB — URINE CULTURE

## 2023-09-07 ENCOUNTER — Ambulatory Visit (HOSPITAL_COMMUNITY): Payer: Self-pay

## 2023-09-09 MED ORDER — CLOTRIMAZOLE 1 % VA CREA: 1.0000 | TOPICAL_CREAM | Freq: Every day | VAGINAL | 0 refills | Status: AC

## 2023-09-09 NOTE — Telephone Encounter (Signed)
 Pharmacy calling in for Clotrimazole  prescription to be changed to vaginal suppository versus topical. According to previous notes this med was to be vaginal suppository. Updated medication sent in to preferred pharmacy.

## 2023-11-23 ENCOUNTER — Other Ambulatory Visit: Payer: Self-pay

## 2023-12-07 ENCOUNTER — Ambulatory Visit: Admission: EM | Admit: 2023-12-07 | Discharge: 2023-12-07 | Disposition: A | Discharge status: Home / Self Care

## 2023-12-07 ENCOUNTER — Ambulatory Visit

## 2023-12-07 NOTE — ED Notes (Signed)
 LWBS before triage. Hailey RT called pt at this time. Pt states she is actively making an appointment for tomorrow.

## 2024-01-10 ENCOUNTER — Other Ambulatory Visit

## 2024-01-10 ENCOUNTER — Ambulatory Visit

## 2024-05-24 ENCOUNTER — Encounter (HOSPITAL_COMMUNITY)
